# Patient Record
Sex: Female | Born: 1965 | Race: White | Hispanic: No | Marital: Single | Smoking: Former smoker
Health system: Southern US, Community
[De-identification: ages and names within clinical notes are randomized; demographics above are authoritative.]

## PROBLEM LIST (undated history)

## (undated) DIAGNOSIS — R590 Localized enlarged lymph nodes: Secondary | ICD-10-CM

## (undated) DIAGNOSIS — T7840XA Allergy, unspecified, initial encounter: Secondary | ICD-10-CM

## (undated) HISTORY — DX: Allergy, unspecified, initial encounter: T78.40XA

---

## 1998-04-11 ENCOUNTER — Ambulatory Visit (HOSPITAL_COMMUNITY): Admission: RE | Admit: 1998-04-11 | Discharge: 1998-04-11 | Payer: Self-pay | Admitting: Family Medicine

## 1998-09-17 ENCOUNTER — Ambulatory Visit (HOSPITAL_COMMUNITY): Admission: RE | Admit: 1998-09-17 | Discharge: 1998-09-17 | Payer: Self-pay | Admitting: Family Medicine

## 1998-09-17 ENCOUNTER — Encounter: Payer: Self-pay | Admitting: Family Medicine

## 1999-05-29 ENCOUNTER — Other Ambulatory Visit: Admission: RE | Admit: 1999-05-29 | Discharge: 1999-05-29 | Payer: Self-pay | Admitting: Gynecology

## 2000-12-06 ENCOUNTER — Ambulatory Visit (HOSPITAL_COMMUNITY): Admission: RE | Admit: 2000-12-06 | Discharge: 2000-12-06 | Payer: Self-pay | Admitting: Obstetrics and Gynecology

## 2000-12-06 ENCOUNTER — Encounter: Payer: Self-pay | Admitting: Obstetrics and Gynecology

## 2001-05-15 ENCOUNTER — Inpatient Hospital Stay (HOSPITAL_COMMUNITY): Admission: AD | Admit: 2001-05-15 | Discharge: 2001-05-20 | Payer: Self-pay | Admitting: Obstetrics and Gynecology

## 2004-04-07 ENCOUNTER — Other Ambulatory Visit: Admission: RE | Admit: 2004-04-07 | Discharge: 2004-04-07 | Payer: Self-pay | Admitting: Family Medicine

## 2014-10-03 ENCOUNTER — Encounter (HOSPITAL_BASED_OUTPATIENT_CLINIC_OR_DEPARTMENT_OTHER): Payer: Self-pay | Admitting: Emergency Medicine

## 2014-10-03 ENCOUNTER — Emergency Department (HOSPITAL_BASED_OUTPATIENT_CLINIC_OR_DEPARTMENT_OTHER)
Admission: EM | Admit: 2014-10-03 | Discharge: 2014-10-03 | Disposition: A | Discharge status: Home / Self Care | Payer: BLUE CROSS/BLUE SHIELD | Attending: Emergency Medicine | Admitting: Emergency Medicine

## 2014-10-03 DIAGNOSIS — I951 Orthostatic hypotension: Secondary | ICD-10-CM

## 2014-10-03 DIAGNOSIS — R55 Syncope and collapse: Secondary | ICD-10-CM

## 2014-10-03 LAB — CBC WITH DIFFERENTIAL/PLATELET
BASOS PCT: 0 % (ref 0–1)
Basophils Absolute: 0 10*3/uL (ref 0.0–0.1)
Eosinophils Absolute: 0 10*3/uL (ref 0.0–0.7)
Eosinophils Relative: 0 % (ref 0–5)
HEMATOCRIT: 36.4 % (ref 36.0–46.0)
HEMOGLOBIN: 12.6 g/dL (ref 12.0–15.0)
Lymphocytes Relative: 18 % (ref 12–46)
Lymphs Abs: 1.1 10*3/uL (ref 0.7–4.0)
MCH: 31.5 pg (ref 26.0–34.0)
MCHC: 34.6 g/dL (ref 30.0–36.0)
MCV: 91 fL (ref 78.0–100.0)
MONO ABS: 0.4 10*3/uL (ref 0.1–1.0)
MONOS PCT: 7 % (ref 3–12)
Neutro Abs: 4.5 10*3/uL (ref 1.7–7.7)
Neutrophils Relative %: 75 % (ref 43–77)
Platelets: 277 10*3/uL (ref 150–400)
RBC: 4 MIL/uL (ref 3.87–5.11)
RDW: 11.3 % — ABNORMAL LOW (ref 11.5–15.5)
WBC: 6 10*3/uL (ref 4.0–10.5)

## 2014-10-03 LAB — BASIC METABOLIC PANEL
Anion gap: 2 — ABNORMAL LOW (ref 5–15)
BUN: 14 mg/dL (ref 6–23)
CALCIUM: 9 mg/dL (ref 8.4–10.5)
CO2: 26 mmol/L (ref 19–32)
CREATININE: 0.71 mg/dL (ref 0.50–1.10)
Chloride: 106 mmol/L (ref 96–112)
GFR calc non Af Amer: >90 mL/min (ref >90)
Glucose, Bld: 120 mg/dL — ABNORMAL HIGH (ref 70–99)
Potassium: 3.7 mmol/L (ref 3.5–5.1)
Sodium: 134 mmol/L — ABNORMAL LOW (ref 135–145)

## 2014-10-03 MED ORDER — SODIUM CHLORIDE 0.9 % IV BOLUS (SEPSIS)
1000.0000 mL | Freq: Once | INTRAVENOUS | Status: AC
  Administered 2014-10-03: 1000 mL via INTRAVENOUS

## 2014-10-03 NOTE — Discharge Instructions (Signed)
Syncope °Syncope is a medical term for fainting or passing out. This means you lose consciousness and drop to the ground. People are generally unconscious for less than 5 minutes. You may have some muscle twitches for up to 15 seconds before waking up and returning to normal. Syncope occurs more often in older adults, but it can happen to anyone. While most causes of syncope are not dangerous, syncope can be a sign of a serious medical problem. It is important to seek medical care.  °CAUSES  °Syncope is caused by a sudden drop in blood flow to the brain. The specific cause is often not determined. Factors that can bring on syncope include: °· Taking medicines that lower blood pressure. °· Sudden changes in posture, such as standing up quickly. °· Taking more medicine than prescribed. °· Standing in one place for too long. °· Seizure disorders. °· Dehydration and excessive exposure to heat. °· Low blood sugar (hypoglycemia). °· Straining to have a bowel movement. °· Heart disease, irregular heartbeat, or other circulatory problems. °· Fear, emotional distress, seeing blood, or severe pain. °SYMPTOMS  °Right before fainting, you may: °· Feel dizzy or light-headed. °· Feel nauseous. °· See all white or all black in your field of vision. °· Have cold, clammy skin. °DIAGNOSIS  °Your health care provider will ask about your symptoms, perform a physical exam, and perform an electrocardiogram (ECG) to record the electrical activity of your heart. Your health care provider may also perform other heart or blood tests to determine the cause of your syncope which may include: °· Transthoracic echocardiogram (TTE). During echocardiography, sound waves are used to evaluate how blood flows through your heart. °· Transesophageal echocardiogram (TEE). °· Cardiac monitoring. This allows your health care provider to monitor your heart rate and rhythm in real time. °· Holter monitor. This is a portable device that records your  heartbeat and can help diagnose heart arrhythmias. It allows your health care provider to track your heart activity for several days, if needed. °· Stress tests by exercise or by giving medicine that makes the heart beat faster. °TREATMENT  °In most cases, no treatment is needed. Depending on the cause of your syncope, your health care provider may recommend changing or stopping some of your medicines. °HOME CARE INSTRUCTIONS °· Have someone stay with you until you feel stable. °· Do not drive, use machinery, or play sports until your health care provider says it is okay. °· Keep all follow-up appointments as directed by your health care provider. °· Lie down right away if you start feeling like you might faint. Breathe deeply and steadily. Wait until all the symptoms have passed. °· Drink enough fluids to keep your urine clear or pale yellow. °· If you are taking blood pressure or heart medicine, get up slowly and take several minutes to sit and then stand. This can reduce dizziness. °SEEK IMMEDIATE MEDICAL CARE IF:  °· You have a severe headache. °· You have unusual pain in the chest, abdomen, or back. °· You are bleeding from your mouth or rectum, or you have black or tarry stool. °· You have an irregular or very fast heartbeat. °· You have pain with breathing. °· You have repeated fainting or seizure-like jerking during an episode. °· You faint when sitting or lying down. °· You have confusion. °· You have trouble walking. °· You have severe weakness. °· You have vision problems. °If you fainted, call your local emergency services (911 in [place] Do not drive   yourself to the hospital.  °MAKE SURE YOU: °· Understand these instructions. °· Will watch your condition. °· Will get help right away if you are not doing well or get worse. °Document Released: 07/26/2005 Document Revised: 07/31/2013 Document Reviewed: 09/24/2011 °[employer]® Patient Information ©2015 [employer]. This information is not intended to replace  advice given to you by your health care provider. Make sure you discuss any questions you have with your health care provider. ° °

## 2014-10-03 NOTE — ED Provider Notes (Signed)
CSN: [id number]     Arrival date & time 10/03/14  1420 History   First MD Initiated Contact with Patient 10/03/14 1423     Chief Complaint  Patient presents with  . Near Syncope     (Consider location/radiation/quality/duration/timing/severity/associated sxs/prior Treatment) HPI Comments: Patient presents to the ER for evaluation of syncope. Patient reports that she donated blood this morning. She went back to her office and then started to feel faint. She got weak, dizzy and then did briefly pass out. After she came around, she felt like she was going to pass out again. EMS was contacted and they found her awake, with a blood pressure of 70/30. Patient was transported to the emergency department. She reports that she is feeling much better now.  Patient denies any recent illness. She has not had any nausea, vomiting or diarrhea. She has no fever. There has not been any chest pain, shortness of breath, abdominal pain associated with her symptoms today.  Patient is a 49 y.o. female presenting with near-syncope.  Near Syncope Pertinent negatives include no chest pain and no shortness of breath.    No past medical history on file. No past surgical history on file. No family history on file. History  Substance Use Topics  . Smoking status: Never Smoker   . Smokeless tobacco: Not on file  . Alcohol Use: Not on file   OB History    No data available     Review of Systems  Respiratory: Negative for shortness of breath.   Cardiovascular: Positive for near-syncope. Negative for chest pain.  Neurological: Positive for dizziness and syncope.  All other systems reviewed and are negative.     Allergies  Review of patient's allergies indicates no known allergies.  Home Medications   Prior to Admission medications   Not on File   BP 107/68 mmHg  Pulse 74  Temp(Src) 98.7 F (37.1 C) (Oral)  Resp 18  Ht 5' 4.5" (1.638 m)  Wt 135 lb (61.236 kg)  BMI 22.82 kg/m2  SpO2  100% Physical Exam  Constitutional: She is oriented to person, place, and time. She appears well-developed and well-nourished. No distress.  HENT:  Head: Normocephalic and atraumatic.  Right Ear: Hearing normal.  Left Ear: Hearing normal.  Nose: Nose normal.  Mouth/Throat: Oropharynx is clear and moist and mucous membranes are normal.  Eyes: Conjunctivae and EOM are normal. Pupils are equal, round, and reactive to light.  Neck: Normal range of motion. Neck supple.  Cardiovascular: Regular rhythm, S1 normal and S2 normal.  Exam reveals no gallop and no friction rub.   No murmur heard. Pulmonary/Chest: Effort normal and breath sounds normal. No respiratory distress. She exhibits no tenderness.  Abdominal: Soft. Normal appearance and bowel sounds are normal. There is no hepatosplenomegaly. There is no tenderness. There is no rebound, no guarding, no tenderness at McBurney's point and negative Murphy's sign. No hernia.  Musculoskeletal: Normal range of motion.  Neurological: She is alert and oriented to person, place, and time. She has normal strength. No cranial nerve deficit or sensory deficit. Coordination normal. GCS eye subscore is 4. GCS verbal subscore is 5. GCS motor subscore is 6.  Skin: Skin is warm, dry and intact. No rash noted. No cyanosis.  Psychiatric: She has a normal mood and affect. Her speech is normal and behavior is normal. Thought content normal.  Nursing note and vitals reviewed.   ED Course  Procedures (including critical care time) Labs Review Labs Reviewed  CBC  WITH DIFFERENTIAL/PLATELET - Abnormal; Notable for the following:    RDW 11.3 (*)    All other components within normal limits  BASIC METABOLIC PANEL    Imaging Review No results found.   EKG Interpretation   Date/Time:  Thursday October 03 2014 14:36:36 EST Ventricular Rate:  71 PR Interval:  116 QRS Duration: 72 QT Interval:  372 QTC Calculation: 404 R Axis:   75 Text Interpretation:   Normal sinus rhythm Normal ECG Confirmed by Arnita Koons   MD, Gayatri Teasdale [id number]) on 10/03/2014 3:03:11 PM      MDM   Final diagnoses:  None   syncope  Patient presents to the ER for evaluation of syncope and dizziness after giving blood. Patient did successfully give blood and was able to go back to her office before she became weak, dizzy and then had a brief syncopal episode. She continued to have some dizziness and feeling like she was going to pass out after she woke up. By time of arrival to the ER, however, she is symptom-free. She is still mildly orthostatic with some increase in her heart rate. Her lab work is unremarkable. Neurologic exam is unremarkable. EKG is normal. Patient to minister to IV fluids with improvement. Symptoms are secondary to getting blood, orthostasis. Will discharge, increase fluid intake.    Orpah Greek, MD 10/03/14 [phone]

## 2014-10-03 NOTE — ED Notes (Signed)
Per EMS:  Pt gave blood today.  Pt had one syncopal episode and one near syncopal episode afterwards.  Pts BP 70/30 when EMS arrived.  Pt is feeling better now, here for evaluation.

## 2015-03-10 ENCOUNTER — Ambulatory Visit (INDEPENDENT_AMBULATORY_CARE_PROVIDER_SITE_OTHER): Payer: BLUE CROSS/BLUE SHIELD | Admitting: Internal Medicine

## 2015-03-10 ENCOUNTER — Encounter: Payer: Self-pay | Admitting: Internal Medicine

## 2015-03-10 VITALS — BP 124/88 | HR 82 | Temp 98.0°F | Resp 18 | Ht 64.5 in | Wt 154.0 lb

## 2015-03-10 DIAGNOSIS — Z1212 Encounter for screening for malignant neoplasm of rectum: Secondary | ICD-10-CM

## 2015-03-10 DIAGNOSIS — Z131 Encounter for screening for diabetes mellitus: Secondary | ICD-10-CM

## 2015-03-10 DIAGNOSIS — Z1389 Encounter for screening for other disorder: Secondary | ICD-10-CM

## 2015-03-10 DIAGNOSIS — E559 Vitamin D deficiency, unspecified: Secondary | ICD-10-CM

## 2015-03-10 DIAGNOSIS — Z130 Encounter for screening for diseases of the blood and blood-forming organs and certain disorders involving the immune mechanism: Secondary | ICD-10-CM

## 2015-03-10 DIAGNOSIS — Z0000 Encounter for general adult medical examination without abnormal findings: Secondary | ICD-10-CM

## 2015-03-10 DIAGNOSIS — Z79899 Other long term (current) drug therapy: Secondary | ICD-10-CM

## 2015-03-10 DIAGNOSIS — M722 Plantar fascial fibromatosis: Secondary | ICD-10-CM

## 2015-03-10 DIAGNOSIS — Z13220 Encounter for screening for lipoid disorders: Secondary | ICD-10-CM

## 2015-03-10 DIAGNOSIS — Z1329 Encounter for screening for other suspected endocrine disorder: Secondary | ICD-10-CM

## 2015-03-10 DIAGNOSIS — Z136 Encounter for screening for cardiovascular disorders: Secondary | ICD-10-CM

## 2015-03-10 DIAGNOSIS — I10 Essential (primary) hypertension: Secondary | ICD-10-CM

## 2015-03-10 LAB — BASIC METABOLIC PANEL WITH GFR
BUN: 10 mg/dL (ref 7–25)
CO2: 27 mmol/L (ref 20–31)
Calcium: 10.1 mg/dL (ref 8.6–10.2)
Chloride: 104 mmol/L (ref 98–110)
Creat: 0.69 mg/dL (ref 0.50–1.10)
GFR, Est African American: >89 mL/min (ref >=60)
GFR, Est Non African American: >89 mL/min (ref >=60)
Glucose, Bld: 63 mg/dL — ABNORMAL LOW (ref 65–99)
Potassium: 3.8 mmol/L (ref 3.5–5.3)
Sodium: 140 mmol/L (ref 135–146)

## 2015-03-10 LAB — CBC WITH DIFFERENTIAL/PLATELET
Basophils Absolute: 0 10*3/uL (ref 0.0–0.1)
Basophils Relative: 1 % (ref 0–1)
Eosinophils Absolute: 0 10*3/uL (ref 0.0–0.7)
Eosinophils Relative: 1 % (ref 0–5)
HCT: 41.8 % (ref 36.0–46.0)
Hemoglobin: 14 g/dL (ref 12.0–15.0)
Lymphocytes Relative: 29 % (ref 12–46)
Lymphs Abs: 1.1 10*3/uL (ref 0.7–4.0)
MCH: 31 pg (ref 26.0–34.0)
MCHC: 33.5 g/dL (ref 30.0–36.0)
MCV: 92.5 fL (ref 78.0–100.0)
MPV: 9.2 fL (ref 8.6–12.4)
Monocytes Absolute: 0.3 10*3/uL (ref 0.1–1.0)
Monocytes Relative: 8 % (ref 3–12)
Neutro Abs: 2.3 10*3/uL (ref 1.7–7.7)
Neutrophils Relative %: 61 % (ref 43–77)
Platelets: 319 10*3/uL (ref 150–400)
RBC: 4.52 MIL/uL (ref 3.87–5.11)
RDW: 13.5 % (ref 11.5–15.5)
WBC: 3.8 10*3/uL — ABNORMAL LOW (ref 4.0–10.5)

## 2015-03-10 LAB — HEPATIC FUNCTION PANEL
ALT: 25 U/L (ref 6–29)
AST: 21 U/L (ref 10–35)
Albumin: 4.2 g/dL (ref 3.6–5.1)
Alkaline Phosphatase: 76 U/L (ref 33–115)
Bilirubin, Direct: 0.1 mg/dL (ref <=0.2)
Indirect Bilirubin: 0.5 mg/dL (ref 0.2–1.2)
Total Bilirubin: 0.6 mg/dL (ref 0.2–1.2)
Total Protein: 7 g/dL (ref 6.1–8.1)

## 2015-03-10 LAB — IRON AND TIBC
%SAT: 36 % (ref 20–55)
Iron: 130 ug/dL (ref 42–145)
TIBC: 364 ug/dL (ref 250–470)
UIBC: 234 ug/dL (ref 125–400)

## 2015-03-10 LAB — LIPID PANEL
Cholesterol: 262 mg/dL — ABNORMAL HIGH (ref 125–200)
HDL: 60 mg/dL (ref >=46)
LDL Cholesterol: 168 mg/dL — ABNORMAL HIGH (ref <130)
Total CHOL/HDL Ratio: 4.4 Ratio (ref <=5.0)
Triglycerides: 168 mg/dL — ABNORMAL HIGH (ref <150)
VLDL: 34 mg/dL — ABNORMAL HIGH (ref <30)

## 2015-03-10 LAB — MAGNESIUM: Magnesium: 1.8 mg/dL (ref 1.5–2.5)

## 2015-03-10 MED ORDER — MELOXICAM 15 MG PO TABS: 15.0000 mg | ORAL_TABLET | Freq: Every day | ORAL | Status: DC

## 2015-03-10 NOTE — Progress Notes (Signed)
Patient ID: Ashley Carpenter, female   DOB: February 24, 1966, 49 y.o.   MRN: [id number]    Annual Screening Comprehensive Examination   This very nice 49 y.o.female presents for complete physical.  Patient has no major health issues and does not take any medications currently.  She is coming to establish as a new patient.   Patient reports that she does have a family history of colon cancer in her mother who passed away from it at age 7.  Her mother was diagnosed with colon cancer at age 85.    She does report that she occasionally has some palpitations that are intermittent and lasts for a minute.  She reports that she originally had thought it was due to anxiety as she has had a job change and her brother passed this year.  She reports that it happens at rest and also with activity.  She reports that she does have 2 cups of coffee daily. She reports no SOB or chest pain with palpitations.    She also reports that her feet are bothering her.  She reports that she has pain especially when she is getting up and going are terrible.  She reports that if she does not wear shoes with good support than they really bother her.  She is currently post menopausal.    Finally, patient has history of Vitamin D Deficiency and last vitamin D was No results found for: VD25OH.  Currently on supplementation.   No current outpatient prescriptions on file prior to visit.   No current facility-administered medications on file prior to visit.    No Known Allergies  Past Medical History  Diagnosis Date  . Allergy     There is no immunization history on file for this patient.  No past surgical history on file.  Family History  Problem Relation Age of Onset  . Cancer Mother   . Hyperlipidemia Mother   . Diabetes Father   . Heart disease Father   . Hypertension Father   . Cancer Maternal Grandmother   . Heart disease Maternal Grandfather   . Hypertension Maternal Grandfather   . Diabetes Paternal  Grandfather     History   Social History  . Marital Status: Single    Spouse Name: N/A  . Number of Children: N/A  . Years of Education: N/A   Occupational History  . Not on file.   Social History Main Topics  . Smoking status: Former Smoker    Quit date: 08/10/1983  . Smokeless tobacco: Not on file  . Alcohol Use: 0.6 oz/week    0 Standard drinks or equivalent, 1 Glasses of wine per week  . Drug Use: Not on file  . Sexual Activity: Not on file   Other Topics Concern  . Not on file   Social History Narrative   Review of Systems  Constitutional: Positive for malaise/fatigue. Negative for fever, chills and weight loss.  HENT: Negative for congestion, ear pain and sore throat.   Eyes: Negative.   Respiratory: Negative for cough, shortness of breath and wheezing.   Cardiovascular: Positive for palpitations. Negative for chest pain and leg swelling.  Gastrointestinal: Negative for heartburn, nausea, vomiting, diarrhea, constipation, blood in stool and melena.  Genitourinary: Negative.   Skin: Negative.   Neurological: Negative for dizziness, sensory change, loss of consciousness and headaches.  Psychiatric/Behavioral: Negative for depression. The patient is not nervous/anxious and does not have insomnia.       Physical Exam  BP 124/88 mmHg  Pulse 82  Temp(Src) 98 F (36.7 C) (Temporal)  Resp 18  Ht 5' 4.5" (1.638 m)  Wt 154 lb (69.854 kg)  BMI 26.04 kg/m2  General Appearance: Well nourished and in no apparent distress. Eyes: PERRLA, EOMs, conjunctiva no swelling or erythema, normal fundi and vessels. Sinuses: No frontal/maxillary tenderness ENT/Mouth: EACs patent / TMs  nl. Nares clear without erythema, swelling, mucoid exudates. Oral hygiene is good. No erythema, swelling, or exudate. Tongue normal, non-obstructing. Tonsils not swollen or erythematous. Hearing normal.  Neck: Supple, thyroid normal. No bruits, nodes or JVD. Respiratory: Respiratory effort normal.   BS equal and clear bilateral without rales, rhonci, wheezing or stridor. Cardio: Heart sounds are normal with regular rate and rhythm and no murmurs, rubs or gallops. Peripheral pulses are normal and equal bilaterally without edema. No aortic or femoral bruits. Chest: symmetric with normal excursions and percussion. Abdomen: Flat, soft, with bowl sounds. Nontender, no guarding, rebound, hernias, masses, or organomegaly.  Lymphatics: Non tender without lymphadenopathy.  Musculoskeletal: Full ROM all peripheral extremities, joint stability, 5/5 strength, and normal gait.  Pain at the insertion of the plantar fascia bilaterally Skin: Warm and dry without rashes, lesions, cyanosis, clubbing or  ecchymosis.  Neuro: Cranial nerves intact, reflexes equal bilaterally. Normal muscle tone, no cerebellar symptoms. Sensation intact.  Pysch: Awake and oriented X 3, normal affect, Insight and Judgment appropriate.   Assessment and Plan    1. Vitamin D deficiency -start supplement - Vit D  25 hydroxy (rtn osteoporosis monitoring)  2. Medication management  - CBC with Differential/Platelet - BASIC METABOLIC PANEL WITH GFR - Hepatic function panel - Magnesium  3. Screening for cardiovascular condition  - EKG 12-Lead  4. Screening for diabetes mellitus  - Hemoglobin A1c - Insulin, random  5. Screening for hyperlipidemia  - Lipid panel  6. Screening for deficiency anemia  - Iron and TIBC - Vitamin B12  7. Screening for hematuria or proteinuria  - Urinalysis, Routine w reflex microscopic (not at [facility]) - Microalbumin / creatinine urine ratio  8. Screening for thyroid disorder  - TSH  9. Screening for rectal cancer  - Ambulatory referral to Gastroenterology  10.  Plantar Fascitis -mobic -good shoes -RICE -if not improved in 1 month than consider referral to ortho   Continue prudent diet as discussed, weight control, regular exercise, and medications. Routine screening labs  and tests as requested with regular follow-up as recommended.  Over 40 minutes of exam, counseling, chart review and critical decision making was performed

## 2015-03-10 NOTE — Patient Instructions (Signed)
Plantar Fasciitis Plantar fasciitis is a common condition that causes foot pain. It is soreness (inflammation) of the band of tough fibrous tissue on the bottom of the foot that runs from the heel bone (calcaneus) to the ball of the foot. The cause of this soreness may be from excessive standing, poor fitting shoes, running on hard surfaces, being overweight, having an abnormal walk, or overuse (this is common in runners) of the painful foot or feet. It is also common in aerobic exercise dancers and ballet dancers. SYMPTOMS  Most people with plantar fasciitis complain of:  Severe pain in the morning on the bottom of their foot especially when taking the first steps out of bed. This pain recedes after a few minutes of walking.  Severe pain is experienced also during walking following a long period of inactivity.  Pain is worse when walking barefoot or up stairs DIAGNOSIS   Your caregiver will diagnose this condition by examining and feeling your foot.  Special tests such as X-rays of your foot, are usually not needed. PREVENTION   Consult a sports medicine professional before beginning a new exercise program.  Walking programs offer a good workout. With walking there is a lower chance of overuse injuries common to runners. There is less impact and less jarring of the joints.  Begin all new exercise programs slowly. If problems or pain develop, decrease the amount of time or distance until you are at a comfortable level.  Wear good shoes and replace them regularly.  Stretch your foot and the heel cords at the back of the ankle (Achilles tendon) both before and after exercise.  Run or exercise on even surfaces that are not hard. For example, asphalt is better than pavement.  Do not run barefoot on hard surfaces.  If using a treadmill, vary the incline.  Do not continue to workout if you have foot or joint problems. Seek professional help if they do not improve. HOME CARE INSTRUCTIONS     Avoid activities that cause you pain until you recover.  Use ice or cold packs on the problem or painful areas after working out.  Only take over-the-counter or prescription medicines for pain, discomfort, or fever as directed by your caregiver.  Soft shoe inserts or athletic shoes with air or gel sole cushions may be helpful.  If problems continue or become more severe, consult a sports medicine caregiver or your own health care provider. Cortisone is a potent anti-inflammatory medication that may be injected into the painful area. You can discuss this treatment with your caregiver. MAKE SURE YOU:   Understand these instructions.  Will watch your condition.  Will get help right away if you are not doing well or get worse. Document Released: 04/20/2001 Document Revised: 10/18/2011 Document Reviewed: 06/19/2008 [employer] Patient Information 2015 [employer]. This information is not intended to replace advice given to you by your health care provider. Make sure you discuss any questions you have with your health care provider.   Preventive Care for Adults  A healthy lifestyle and preventive care can promote health and wellness. Preventive health guidelines for women include the following key practices.  A routine yearly physical is a good way to check with your health care provider about your health and preventive screening. It is a chance to share any concerns and updates on your health and to receive a thorough exam.  Visit your dentist for a routine exam and preventive care every 6 months. Brush your teeth twice a day   and floss once a day. Good oral hygiene prevents tooth decay and gum disease.  The frequency of eye exams is based on your age, health, family medical history, use of contact lenses, and other factors. Follow your health care provider's recommendations for frequency of eye exams.  Eat a healthy diet. Foods like vegetables, fruits, whole grains, low-fat dairy  products, and lean protein foods contain the nutrients you need without too many calories. Decrease your intake of foods high in solid fats, added sugars, and salt. Eat the right amount of calories for you.Get information about a proper diet from your health care provider, if necessary.  Regular physical exercise is one of the most important things you can do for your health. Most adults should get at least 150 minutes of moderate-intensity exercise (any activity that increases your heart rate and causes you to sweat) each week. In addition, most adults need muscle-strengthening exercises on 2 or more days a week.  Maintain a healthy weight. The body mass index (BMI) is a screening tool to identify possible weight problems. It provides an estimate of body fat based on height and weight. Your health care provider can find your BMI and can help you achieve or maintain a healthy weight.For adults 20 years and older:  A BMI below 18.5 is considered underweight.  A BMI of 18.5 to 24.9 is normal.  A BMI of 25 to 29.9 is considered overweight.  A BMI of 30 and above is considered obese.  Maintain normal blood lipids and cholesterol levels by exercising and minimizing your intake of saturated fat. Eat a balanced diet with plenty of fruit and vegetables. Blood tests for lipids and cholesterol should begin at age 77 and be repeated every 5 years. If your lipid or cholesterol levels are high, you are over 50, or you are at high risk for heart disease, you may need your cholesterol levels checked more frequently.Ongoing high lipid and cholesterol levels should be treated with medicines if diet and exercise are not working.  If you smoke, find out from your health care provider how to quit. If you do not use tobacco, do not start.  Lung cancer screening is recommended for adults aged 13-80 years who are at high risk for developing lung cancer because of a history of smoking. A yearly low-dose CT scan of the  lungs is recommended for people who have at least a 30-pack-year history of smoking and are a current smoker or have quit within the past 15 years. A pack year of smoking is smoking an average of 1 pack of cigarettes a day for 1 year (for example: 1 pack a day for 30 years or 2 packs a day for 15 years). Yearly screening should continue until the smoker has stopped smoking for at least 15 years. Yearly screening should be stopped for people who develop a health problem that would prevent them from having lung cancer treatment.  High blood pressure causes heart disease and increases the risk of stroke. Your blood pressure should be checked at least every 1 to 2 years. Ongoing high blood pressure should be treated with medicines if weight loss and exercise do not work.  If you are 26-78 years old, ask your health care provider if you should take aspirin to prevent strokes.  Diabetes screening involves taking a blood sample to check your fasting blood sugar level. This should be done once every 3 years, after age 38, if you are within normal weight and without risk  factors for diabetes. Testing should be considered at a younger age or be carried out more frequently if you are overweight and have at least 1 risk factor for diabetes.  Breast cancer screening is essential preventive care for women. You should practice "breast self-awareness." This means understanding the normal appearance and feel of your breasts and may include breast self-examination. Any changes detected, no matter how small, should be reported to a health care provider. Women in their 38s and 30s should have a clinical breast exam (CBE) by a health care provider as part of a regular health exam every 1 to 3 years. After age 7, women should have a CBE every year. Starting at age 29, women should consider having a mammogram (breast X-ray test) every year. Women who have a family history of breast cancer should talk to their health care provider  about genetic screening. Women at a high risk of breast cancer should talk to their health care providers about having an MRI and a mammogram every year.  Breast cancer gene (BRCA)-related cancer risk assessment is recommended for women who have family members with BRCA-related cancers. BRCA-related cancers include breast, ovarian, tubal, and peritoneal cancers. Having family members with these cancers may be associated with an increased risk for harmful changes (mutations) in the breast cancer genes BRCA1 and BRCA2. Results of the assessment will determine the need for genetic counseling and BRCA1 and BRCA2 testing.  Routine pelvic exams to screen for cancer are no longer recommended for nonpregnant women who are considered low risk for cancer of the pelvic organs (ovaries, uterus, and vagina) and who do not have symptoms. Ask your health care provider if a screening pelvic exam is right for you.  If you have had past treatment for cervical cancer or a condition that could lead to cancer, you need Pap tests and screening for cancer for at least 20 years after your treatment. If Pap tests have been discontinued, your risk factors (such as having a new sexual partner) need to be reassessed to determine if screening should be resumed. Some women have medical problems that increase the chance of getting cervical cancer. In these cases, your health care provider may recommend more frequent screening and Pap tests.  Colorectal cancer can be detected and often prevented. Most routine colorectal cancer screening begins at the age of 13 years and continues through age 9 years. However, your health care provider may recommend screening at an earlier age if you have risk factors for colon cancer. On a yearly basis, your health care provider may provide home test kits to check for hidden blood in the stool. Use of a small camera at the end of a tube, to directly examine the colon (sigmoidoscopy or colonoscopy), can  detect the earliest forms of colorectal cancer. Talk to your health care provider about this at age 84, when routine screening begins. Direct exam of the colon should be repeated every 5-10 years through age 64 years, unless early forms of pre-cancerous polyps or small growths are found.  Hepatitis C blood testing is recommended for all people born from [birth year] through 1965 and any individual with known risks for hepatitis C.  Pra  Osteoporosis is a disease in which the bones lose minerals and strength with aging. This can result in serious bone fractures or breaks. The risk of osteoporosis can be identified using a bone density scan. Women ages 36 years and over and women at risk for fractures or osteoporosis should discuss screening with  their health care providers. Ask your health care provider whether you should take a calcium supplement or vitamin D to reduce the rate of osteoporosis.  Menopause can be associated with physical symptoms and risks. Hormone replacement therapy is available to decrease symptoms and risks. You should talk to your health care provider about whether hormone replacement therapy is right for you.  Use sunscreen. Apply sunscreen liberally and repeatedly throughout the day. You should seek shade when your shadow is shorter than you. Protect yourself by wearing long sleeves, pants, a wide-brimmed hat, and sunglasses year round, whenever you are outdoors.  Once a month, do a whole body skin exam, using a mirror to look at the skin on your back. Tell your health care provider of new moles, moles that have irregular borders, moles that are larger than a pencil eraser, or moles that have changed in shape or color.  Stay current with required vaccines (immunizations).  Influenza vaccine. All adults should be immunized every year.  Tetanus, diphtheria, and acellular pertussis (Td, Tdap) vaccine. Pregnant women should receive 1 dose of Tdap vaccine during each pregnancy. The dose  should be obtained regardless of the length of time since the last dose. Immunization is preferred during the 27th-36th week of gestation. An adult who has not previously received Tdap or who does not know her vaccine status should receive 1 dose of Tdap. This initial dose should be followed by tetanus and diphtheria toxoids (Td) booster doses every 10 years. Adults with an unknown or incomplete history of completing a 3-dose immunization series with Td-containing vaccines should begin or complete a primary immunization series including a Tdap dose. Adults should receive a Td booster every 10 years.  Varicella vaccine. An adult without evidence of immunity to varicella should receive 2 doses or a second dose if she has previously received 1 dose. Pregnant females who do not have evidence of immunity should receive the first dose after pregnancy. This first dose should be obtained before leaving the health care facility. The second dose should be obtained 4-8 weeks after the first dose.  Human papillomavirus (HPV) vaccine. Females aged 13-26 years who have not received the vaccine previously should obtain the 3-dose series. The vaccine is not recommended for use in pregnant females. However, pregnancy testing is not needed before receiving a dose. If a female is found to be pregnant after receiving a dose, no treatment is needed. In that case, the remaining doses should be delayed until after the pregnancy. Immunization is recommended for any person with an immunocompromised condition through the age of 15 years if she did not get any or all doses earlier. During the 3-dose series, the second dose should be obtained 4-8 weeks after the first dose. The third dose should be obtained 24 weeks after the first dose and 16 weeks after the second dose.  Zoster vaccine. One dose is recommended for adults aged 35 years or older unless certain conditions are present.  Measles, mumps, and rubella (MMR) vaccine. Adults  born before 14 generally are considered immune to measles and mumps. Adults born in [birth year] or later should have 1 or more doses of MMR vaccine unless there is a contraindication to the vaccine or there is laboratory evidence of immunity to each of the three diseases. A routine second dose of MMR vaccine should be obtained at least 28 days after the first dose for students attending postsecondary schools, health care workers, or international travelers. People who received inactivated measles vaccine or  an unknown type of measles vaccine during 1963-1967 should receive 2 doses of MMR vaccine. People who received inactivated mumps vaccine or an unknown type of mumps vaccine before 1979 and are at high risk for mumps infection should consider immunization with 2 doses of MMR vaccine. For females of childbearing age, rubella immunity should be determined. If there is no evidence of immunity, females who are not pregnant should be vaccinated. If there is no evidence of immunity, females who are pregnant should delay immunization until after pregnancy. Unvaccinated health care workers born before [birth year] who lack laboratory evidence of measles, mumps, or rubella immunity or laboratory confirmation of disease should consider measles and mumps immunization with 2 doses of MMR vaccine or rubella immunization with 1 dose of MMR vaccine.  Pneumococcal 13-valent conjugate (PCV13) vaccine. When indicated, a person who is uncertain of her immunization history and has no record of immunization should receive the PCV13 vaccine. An adult aged 28 years or older who has certain medical conditions and has not been previously immunized should receive 1 dose of PCV13 vaccine. This PCV13 should be followed with a dose of pneumococcal polysaccharide (PPSV23) vaccine. The PPSV23 vaccine dose should be obtained at least 8 weeks after the dose of PCV13 vaccine. An adult aged 29 years or older who has certain medical conditions and previously  received 1 or more doses of PPSV23 vaccine should receive 1 dose of PCV13. The PCV13 vaccine dose should be obtained 1 or more years after the last PPSV23 vaccine dose.    Pneumococcal polysaccharide (PPSV23) vaccine. When PCV13 is also indicated, PCV13 should be obtained first. All adults aged 96 years and older should be immunized. An adult younger than age 61 years who has certain medical conditions should be immunized. Any person who resides in a nursing home or long-term care facility should be immunized. An adult smoker should be immunized. People with an immunocompromised condition and certain other conditions should receive both PCV13 and PPSV23 vaccines. People with human immunodeficiency virus (HIV) infection should be immunized as soon as possible after diagnosis. Immunization during chemotherapy or radiation therapy should be avoided. Routine use of PPSV23 vaccine is not recommended for American Indians, [place]atives, or people younger than 65 years unless there are medical conditions that require PPSV23 vaccine. When indicated, people who have unknown immunization and have no record of immunization should receive PPSV23 vaccine. One-time revaccination 5 years after the first dose of PPSV23 is recommended for people aged 19-64 years who have chronic kidney failure, nephrotic syndrome, asplenia, or immunocompromised conditions. People who received 1-2 doses of PPSV23 before age 10 years should receive another dose of PPSV23 vaccine at age 69 years or later if at least 5 years have passed since the previous dose. Doses of PPSV23 are not needed for people immunized with PPSV23 at or after age 7 years.  Preventive Services / Frequency   Ages 55 to 21 years  Blood pressure check.  Lipid and cholesterol check.  Lung cancer screening. / Every year if you are aged 3-80 years and have a 30-pack-year history of smoking and currently smoke or have quit within the past 15 years. Yearly screening  is stopped once you have quit smoking for at least 15 years or develop a health problem that would prevent you from having lung cancer treatment.  Clinical breast exam.** / Every year after age 72 years.  BRCA-related cancer risk assessment.** / For women who have family members with a BRCA-related cancer (breast, ovarian,  tubal, or peritoneal cancers).  Mammogram.** / Every year beginning at age 45 years and continuing for as long as you are in good health. Consult with your health care provider.  Pap test.** / Every 3 years starting at age 53 years through age 23 or 74 years with a history of 3 consecutive normal Pap tests.  HPV screening.** / Every 3 years from ages 19 years through ages 35 to 64 years with a history of 3 consecutive normal Pap tests.  Fecal occult blood test (FOBT) of stool. / Every year beginning at age 40 years and continuing until age 10 years. You may not need to do this test if you get a colonoscopy every 10 years.  Flexible sigmoidoscopy or colonoscopy.** / Every 5 years for a flexible sigmoidoscopy or every 10 years for a colonoscopy beginning at age 47 years and continuing until age 63 years.  Hepatitis C blood test.** / For all people born from [birth year] through 1966 and any individual with known risks for hepatitis C.  Skin self-exam. / Monthly.  Influenza vaccine. / Every year.  Tetanus, diphtheria, and acellular pertussis (Tdap/Td) vaccine.** / Consult your health care provider. Pregnant women should receive 1 dose of Tdap vaccine during each pregnancy. 1 dose of Td every 10 years.  Varicella vaccine.** / Consult your health care provider. Pregnant females who do not have evidence of immunity should receive the first dose after pregnancy.  Zoster vaccine.** / 1 dose for adults aged 89 years or older.  Pneumococcal 13-valent conjugate (PCV13) vaccine.** / Consult your health care provider.  Pneumococcal polysaccharide (PPSV23) vaccine.** / 1 to 2 doses if you  smoke cigarettes or if you have certain conditions.  Meningococcal vaccine.** / Consult your health care provider.  Hepatitis A vaccine.** / Consult your health care provider.  Hepatitis B vaccine.** / Consult your health care provider. Screening for abdominal aortic aneurysm (AAA)  by ultrasound is recommended for people over 50 who have history of high blood pressure or who are current or former smokers.

## 2015-03-11 LAB — URINALYSIS, ROUTINE W REFLEX MICROSCOPIC
Bilirubin Urine: NEGATIVE
Glucose, UA: NEGATIVE
Hgb urine dipstick: NEGATIVE
Ketones, ur: NEGATIVE
Nitrite: NEGATIVE
Protein, ur: NEGATIVE
Specific Gravity, Urine: 1.003 (ref 1.001–1.035)
pH: 6.5 (ref 5.0–8.0)

## 2015-03-11 LAB — INSULIN, RANDOM: Insulin: 25.4 u[IU]/mL — ABNORMAL HIGH (ref 2.0–19.6)

## 2015-03-11 LAB — HEMOGLOBIN A1C
Hgb A1c MFr Bld: 5.1 % (ref <5.7)
Mean Plasma Glucose: 100 mg/dL (ref <117)

## 2015-03-11 LAB — URINALYSIS, MICROSCOPIC ONLY
Bacteria, UA: NONE SEEN [HPF]
CRYSTALS: NONE SEEN [HPF]
Casts: NONE SEEN [LPF]
RBC / HPF: NONE SEEN RBC/HPF (ref <=2)
SQUAMOUS EPITHELIAL / LPF: NONE SEEN [HPF] (ref <=5)
WBC UA: NONE SEEN WBC/HPF (ref <=5)
Yeast: NONE SEEN [HPF]

## 2015-03-11 LAB — VITAMIN D 25 HYDROXY (VIT D DEFICIENCY, FRACTURES): VIT D 25 HYDROXY: 25 ng/mL — AB (ref 30–100)

## 2015-03-11 LAB — MICROALBUMIN / CREATININE URINE RATIO
CREATININE, URINE: 32.3 mg/dL
Microalb, Ur: <0.2 mg/dL (ref <2.0)

## 2015-03-11 LAB — TSH: TSH: 1.309 u[IU]/mL (ref 0.350–4.500)

## 2015-03-11 LAB — VITAMIN B12: Vitamin B-12: 380 pg/mL (ref 211–911)

## 2015-04-16 ENCOUNTER — Encounter: Payer: Self-pay | Admitting: Gastroenterology

## 2015-05-15 ENCOUNTER — Other Ambulatory Visit: Payer: Self-pay | Admitting: Internal Medicine

## 2015-06-17 ENCOUNTER — Other Ambulatory Visit: Payer: Self-pay | Admitting: Internal Medicine

## 2015-06-23 ENCOUNTER — Ambulatory Visit (AMBULATORY_SURGERY_CENTER): Payer: Self-pay | Admitting: *Deleted

## 2015-06-23 VITALS — Ht 64.0 in | Wt 151.8 lb

## 2015-06-23 DIAGNOSIS — Z1211 Encounter for screening for malignant neoplasm of colon: Secondary | ICD-10-CM

## 2015-06-23 MED ORDER — SUPREP BOWEL PREP KIT 17.5-3.13-1.6 GM/177ML PO SOLN: 1.0000 | Freq: Once | ORAL | Status: DC

## 2015-06-23 NOTE — Progress Notes (Signed)
Patient denies any allergies to egg or soy products. Patient denies complications with anesthesia/sedation.  Patient denies oxygen use at home and denies diet medications. Emmi instructions for colonoscopy explained but patient denied.     

## 2015-06-26 ENCOUNTER — Encounter: Payer: Self-pay | Admitting: Gastroenterology

## 2015-07-07 ENCOUNTER — Ambulatory Visit (AMBULATORY_SURGERY_CENTER): Payer: BLUE CROSS/BLUE SHIELD | Admitting: Gastroenterology

## 2015-07-07 ENCOUNTER — Encounter: Payer: Self-pay | Admitting: Gastroenterology

## 2015-07-07 VITALS — BP 109/66 | HR 66 | Temp 97.7°F | Resp 15 | Ht 64.0 in | Wt 151.0 lb

## 2015-07-07 DIAGNOSIS — D122 Benign neoplasm of ascending colon: Secondary | ICD-10-CM | POA: Diagnosis not present

## 2015-07-07 DIAGNOSIS — Z1211 Encounter for screening for malignant neoplasm of colon: Secondary | ICD-10-CM

## 2015-07-07 DIAGNOSIS — K633 Ulcer of intestine: Secondary | ICD-10-CM | POA: Diagnosis not present

## 2015-07-07 DIAGNOSIS — K639 Disease of intestine, unspecified: Secondary | ICD-10-CM | POA: Diagnosis not present

## 2015-07-07 DIAGNOSIS — D123 Benign neoplasm of transverse colon: Secondary | ICD-10-CM | POA: Diagnosis not present

## 2015-07-07 MED ORDER — SODIUM CHLORIDE 0.9 % IV SOLN: 500.0000 mL | INTRAVENOUS | Status: DC

## 2015-07-07 NOTE — Progress Notes (Signed)
Called to room to assist during endoscopic procedure.  Patient ID and intended procedure confirmed with present staff. Received instructions for my participation in the procedure from the performing physician.  

## 2015-07-07 NOTE — Patient Instructions (Signed)
Impressions/recommendations:  Erosions biopsied, await pathology results  YOU HAD AN ENDOSCOPIC PROCEDURE TODAY AT [facility]:   Refer to the procedure report that was given to you for any specific questions about what was found during the examination.  If the procedure report does not answer your questions, please call your gastroenterologist to clarify.  If you requested that your care partner not be given the details of your procedure findings, then the procedure report has been included in a sealed envelope for you to review at your convenience later.  YOU SHOULD EXPECT: Some feelings of bloating in the abdomen. Passage of more gas than usual.  Walking can help get rid of the air that was put into your GI tract during the procedure and reduce the bloating. If you had a lower endoscopy (such as a colonoscopy or flexible sigmoidoscopy) you may notice spotting of blood in your stool or on the toilet paper. If you underwent a bowel prep for your procedure, you may not have a normal bowel movement for a few days.  Please Note:  You might notice some irritation and congestion in your nose or some drainage.  This is from the oxygen used during your procedure.  There is no need for concern and it should clear up in a day or so.  SYMPTOMS TO REPORT IMMEDIATELY:   Following lower endoscopy (colonoscopy or flexible sigmoidoscopy):  Excessive amounts of blood in the stool  Significant tenderness or worsening of abdominal pains  Swelling of the abdomen that is new, acute  Fever of 100F or higher   For urgent or emergent issues, a gastroenterologist can be reached at any hour by calling [phone].   DIET: Your first meal following the procedure should be a small meal and then it is ok to progress to your normal diet. Heavy or fried foods are harder to digest and may make you feel nauseous or bloated.  Likewise, meals heavy in dairy and vegetables can increase bloating.  Drink  plenty of fluids but you should avoid alcoholic beverages for 24 hours.  ACTIVITY:  You should plan to take it easy for the rest of today and you should NOT DRIVE or use heavy machinery until tomorrow (because of the sedation medicines used during the test).    FOLLOW UP: Our staff will call the number listed on your records the next business day following your procedure to check on you and address any questions or concerns that you may have regarding the information given to you following your procedure. If we do not reach you, we will leave a message.  However, if you are feeling well and you are not experiencing any problems, there is no need to return our call.  We will assume that you have returned to your regular daily activities without incident.  If any biopsies were taken you will be contacted by phone or by letter within the next 1-3 weeks.  Please call us at [phone] if you have not heard about the biopsies in 3 weeks.    SIGNATURES/CONFIDENTIALITY: You and/or your care partner have signed paperwork which will be entered into your electronic medical record.  These signatures attest to the fact that that the information above on your After Visit Summary has been reviewed and is understood.  Full responsibility of the confidentiality of this discharge information lies with you and/or your care-partner.

## 2015-07-07 NOTE — Op Note (Signed)
[facility]  [address]. [place], [id number]   COLONOSCOPY PROCEDURE REPORT  PATIENT: Ashley Carpenter, Ashley Carpenter  MR#: [id number] BIRTHDATE: Sep 09, 1965 , 49  yrs. old GENDER: female ENDOSCOPIST: Ladene Artist, MD, [facility] REFERRED BY:  Unk Pinto, M.D. PROCEDURE DATE:  07/07/2015 PROCEDURE:   Colonoscopy, screening and Colonoscopy with biopsy First Screening Colonoscopy - Avg.  risk and is 50 yrs.  old or older Yes.  Prior Negative Screening - Now for repeat screening. N/A  History of Adenoma - Now for follow-up colonoscopy & has been > or = to 3 yrs.  N/A  Polyps removed today? No Recommend repeat exam, <10 yrs? No ASA CLASS:   Class II INDICATIONS:Screening for colonic neoplasia and Colorectal Neoplasm Risk Assessment for this procedure is average risk. MEDICATIONS: Monitored anesthesia care and Propofol 240 mg IV DESCRIPTION OF PROCEDURE:   After the risks benefits and alternatives of the procedure were thoroughly explained, informed consent was obtained.  The digital rectal exam revealed no abnormalities of the rectum.   The LB PFC-H190 [id number]  endoscope was introduced through the anus and advanced to the cecum, which was identified by both the appendix and ileocecal valve. No adverse events experienced.   The quality of the prep was excellent. (Suprep was used)  The instrument was then slowly withdrawn as the colon was fully examined. Estimated blood loss is zero unless otherwise noted in this procedure report.    COLON FINDINGS: An erosion with surrouding nodularity was found in the proximal ascending colon adjancent to the IC valve. Multiple biopsies were performed. A single non-bleeding, clean-based, shallow and round ulcer with surrouding nodularity was found at the hepatic flexure.  Biopsies were taken at the edge of the ulcer. Two shallow and round erosions with surrouding erythema were found at the hepatic flexure. The examination was otherwise  normal. Retroflexed views revealed no abnormalities. The time to cecum = 1.8 Withdrawal time = 12.8   The scope was withdrawn and the procedure completed. COMPLICATIONS: There were no immediate complications.  ENDOSCOPIC IMPRESSION: 1.   Erosion with surrouding nodularity in the proximal ascending colon; multiple biopsies performed 2.   Single ulcer at the hepatic flexure; biopsies performed 3.   Two erosions at the hepatic flexure  RECOMMENDATIONS: 1.  Await pathology results, suspect Mobic related ulcer and erosions 2.  Continue current colorectal screening recommendations for "routine risk" patients with a repeat colonoscopy in 10 years.  eSigned:  Ladene Artist, MD, [facility] 07/07/2015 9:43 AM

## 2015-07-07 NOTE — Progress Notes (Signed)
Report to PACU, RN, vss, BBS= Clear.  

## 2015-07-08 ENCOUNTER — Telehealth: Payer: Self-pay | Admitting: *Deleted

## 2015-07-08 NOTE — Telephone Encounter (Signed)
  Follow up Call-  Call back number 07/07/2015  Post procedure Call Back phone  # [phone]  Permission to leave phone message Yes     Patient questions:  Do you have a fever, pain , or abdominal swelling? No. Pain Score  0 *  Have you tolerated food without any problems? Yes.    Have you been able to return to your normal activities? Yes.    Do you have any questions about your discharge instructions: Diet   No. Medications  No. Follow up visit  No.  Do you have questions or concerns about your Care? No.  Actions: * If pain score is 4 or above: No action needed, pain <4.

## 2015-09-12 ENCOUNTER — Ambulatory Visit (AMBULATORY_SURGERY_CENTER): Payer: Self-pay

## 2015-09-12 VITALS — Ht 64.0 in | Wt 154.6 lb

## 2015-09-12 DIAGNOSIS — Z860100 Personal history of colon polyps, unspecified: Secondary | ICD-10-CM

## 2015-09-12 NOTE — Progress Notes (Signed)
No allergies to eggs or soy No past problems with anesthesia No home oxygen No diet/weight loss meds  Has email and internet; refused emmi 

## 2015-09-26 ENCOUNTER — Ambulatory Visit (AMBULATORY_SURGERY_CENTER): Payer: BLUE CROSS/BLUE SHIELD | Admitting: Gastroenterology

## 2015-09-26 ENCOUNTER — Encounter: Payer: Self-pay | Admitting: Gastroenterology

## 2015-09-26 VITALS — BP 115/57 | HR 64 | Temp 98.0°F | Resp 18 | Ht 64.0 in | Wt 154.0 lb

## 2015-09-26 DIAGNOSIS — K633 Ulcer of intestine: Secondary | ICD-10-CM

## 2015-09-26 DIAGNOSIS — R933 Abnormal findings on diagnostic imaging of other parts of digestive tract: Secondary | ICD-10-CM | POA: Diagnosis not present

## 2015-09-26 MED ORDER — SODIUM CHLORIDE 0.9 % IV SOLN: 500.0000 mL | INTRAVENOUS | Status: DC

## 2015-09-26 NOTE — Progress Notes (Signed)
Discharge instructions given by Judson Roch Monday, RN

## 2015-09-26 NOTE — Op Note (Signed)
[place]  [address]. [place], [address]   COLONOSCOPY PROCEDURE REPORT  PATIENT: Ashley, Carpenter  MR#: [id number] BIRTHDATE: 1965/10/04 , 49  yrs. old GENDER: female ENDOSCOPIST: Ladene Artist, MD, [facility] REFERRED BY:  Unk Pinto, M.D. PROCEDURE DATE:  09/26/2015 PROCEDURE:   Colonoscopy, diagnostic First Screening Colonoscopy - Avg.  risk and is 50 yrs.  old or older - No.  Prior Negative Screening - Now for repeat screening. N/A  History of Adenoma - Now for follow-up colonoscopy & has been > or = to 3 yrs.  N/A  Polyps removed today? No Recommend repeat exam, <10 yrs? No ASA CLASS:   Class II INDICATIONS:Treatment of bleeding from such lesions as vascular malformation, ulceration, neoplasia, & polypectomy site. MEDICATIONS: Monitored anesthesia care and Propofol 200 mg IV DESCRIPTION OF PROCEDURE:   After the risks benefits and alternatives of the procedure were thoroughly explained, informed consent was obtained.  The digital rectal exam revealed no abnormalities of the rectum.   The LB PFC-H190 [id number]  endoscope was introduced through the anus and advanced to the cecum, which was identified by both the appendix and ileocecal valve. No adverse events experienced.   The quality of the prep was excellent. (Suprep was used)  The instrument was then slowly withdrawn as the colon was fully examined. Estimated blood loss is zero unless otherwise noted in this procedure report.    COLON FINDINGS: A normal appearing cecum, ileocecal valve, and appendiceal orifice were identified.  The ascending, transverse, descending, sigmoid colon, and rectum appeared unremarkable.  The IC valve, ascending colon, hepatic flexure and proximal transverse examined very carefully several times with no abnormalities found including a retroflexed view of the ascending colon. Retroflexed views revealed no abnormalities. The time to cecum = 1.9 Withdrawal time = 12.5   The  scope was withdrawn and the procedure completed.  COMPLICATIONS: There were no immediate complications.  ENDOSCOPIC IMPRESSION: Normal colonoscopy   RECOMMENDATIONS: You should continue to follow colorectal cancer screening guidelines for "routine risk" patients with a repeat colonoscopy in 10 years. There is no need for FOBT (stool) testing for at least 5 years.  eSigned:  Ladene Artist, MD, [facility] 09/26/2015 8:36 AM   []    PATIENT NAME:  Ashley, Carpenter MR#: [id number]

## 2015-09-26 NOTE — Patient Instructions (Signed)
YOU HAD AN ENDOSCOPIC PROCEDURE TODAY AT [facility]:   Refer to the procedure report that was given to you for any specific questions about what was found during the examination.  If the procedure report does not answer your questions, please call your gastroenterologist to clarify.  If you requested that your care partner not be given the details of your procedure findings, then the procedure report has been included in a sealed envelope for you to review at your convenience later.  YOU SHOULD EXPECT: Some feelings of bloating in the abdomen. Passage of more gas than usual.  Walking can help get rid of the air that was put into your GI tract during the procedure and reduce the bloating. If you had a lower endoscopy (such as a colonoscopy or flexible sigmoidoscopy) you may notice spotting of blood in your stool or on the toilet paper. If you underwent a bowel prep for your procedure, you may not have a normal bowel movement for a few days.  Please Note:  You might notice some irritation and congestion in your nose or some drainage.  This is from the oxygen used during your procedure.  There is no need for concern and it should clear up in a day or so.  SYMPTOMS TO REPORT IMMEDIATELY:   Following lower endoscopy (colonoscopy or flexible sigmoidoscopy):  Excessive amounts of blood in the stool  Significant tenderness or worsening of abdominal pains  Swelling of the abdomen that is new, acute  Fever of 100F or higher   For urgent or emergent issues, a gastroenterologist can be reached at any hour by calling [phone].   DIET: Your first meal following the procedure should be a small meal and then it is ok to progress to your normal diet. Heavy or fried foods are harder to digest and may make you feel nauseous or bloated.  Likewise, meals heavy in dairy and vegetables can increase bloating.  Drink plenty of fluids but you should avoid alcoholic beverages for 24  hours.  ACTIVITY:  You should plan to take it easy for the rest of today and you should NOT DRIVE or use heavy machinery until tomorrow (because of the sedation medicines used during the test).    FOLLOW UP: Our staff will call the number listed on your records the next business day following your procedure to check on you and address any questions or concerns that you may have regarding the information given to you following your procedure. If we do not reach you, we will leave a message.  However, if you are feeling well and you are not experiencing any problems, there is no need to return our call.  We will assume that you have returned to your regular daily activities without incident.  If any biopsies were taken you will be contacted by phone or by letter within the next 1-3 weeks.  Please call us at [phone] if you have not heard about the biopsies in 3 weeks.    SIGNATURES/CONFIDENTIALITY: You and/or your care partner have signed paperwork which will be entered into your electronic medical record.  These signatures attest to the fact that that the information above on your After Visit Summary has been reviewed and is understood.  Full responsibility of the confidentiality of this discharge information lies with you and/or your care-partner.  Please, read discharge instructions.  Thank-you for choosing [place] for your healthcare needs today.

## 2015-09-26 NOTE — Progress Notes (Signed)
Report to PACU, RN, vss, BBS= Clear.  

## 2015-09-29 ENCOUNTER — Telehealth: Payer: Self-pay | Admitting: Gastroenterology

## 2015-09-29 ENCOUNTER — Telehealth: Payer: Self-pay | Admitting: *Deleted

## 2015-09-29 NOTE — Telephone Encounter (Signed)
  Follow up Call-  Call back number 09/26/2015 07/07/2015  Post procedure Call Back phone  # [phone]  Permission to leave phone message Yes Yes     Patient questions:  Do you have a fever, pain , or abdominal swelling? No. Pain Score  0 *  Have you tolerated food without any problems? Yes.    Have you been able to return to your normal activities? Yes.    Do you have any questions about your discharge instructions: Diet   No. Medications  No. Follow up visit  No.  Do you have questions or concerns about your Care? No.  Actions: * If pain score is 4 or above: No action needed, pain <4.

## 2015-09-29 NOTE — Telephone Encounter (Signed)
I spoke with Dr. 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 Her impression was that biopsies primarily showed inflammatory changes however there were 3-4 small areas that appeared to have adenomatous changes.  Given that the lesions resolved completely on follow up colonoscopy the "adenomatous" changes were likely regenerative changes and not neoplastic changes.

## 2015-12-28 ENCOUNTER — Encounter: Payer: Self-pay | Admitting: *Deleted

## 2016-02-27 DIAGNOSIS — F4322 Adjustment disorder with anxiety: Secondary | ICD-10-CM | POA: Diagnosis not present

## 2016-03-09 ENCOUNTER — Encounter: Payer: Self-pay | Admitting: Internal Medicine

## 2016-03-09 ENCOUNTER — Ambulatory Visit (INDEPENDENT_AMBULATORY_CARE_PROVIDER_SITE_OTHER): Payer: BLUE CROSS/BLUE SHIELD | Admitting: Internal Medicine

## 2016-03-09 VITALS — BP 118/64 | HR 74 | Temp 97.9°F | Resp 14 | Ht 64.0 in | Wt 153.0 lb

## 2016-03-09 DIAGNOSIS — Z136 Encounter for screening for cardiovascular disorders: Secondary | ICD-10-CM | POA: Diagnosis not present

## 2016-03-09 DIAGNOSIS — Z13220 Encounter for screening for lipoid disorders: Secondary | ICD-10-CM

## 2016-03-09 DIAGNOSIS — Z0000 Encounter for general adult medical examination without abnormal findings: Secondary | ICD-10-CM

## 2016-03-09 DIAGNOSIS — Z79899 Other long term (current) drug therapy: Secondary | ICD-10-CM

## 2016-03-09 DIAGNOSIS — Z23 Encounter for immunization: Secondary | ICD-10-CM

## 2016-03-09 DIAGNOSIS — Z131 Encounter for screening for diabetes mellitus: Secondary | ICD-10-CM

## 2016-03-09 DIAGNOSIS — E559 Vitamin D deficiency, unspecified: Secondary | ICD-10-CM | POA: Diagnosis not present

## 2016-03-09 DIAGNOSIS — Z130 Encounter for screening for diseases of the blood and blood-forming organs and certain disorders involving the immune mechanism: Secondary | ICD-10-CM

## 2016-03-09 DIAGNOSIS — Z1389 Encounter for screening for other disorder: Secondary | ICD-10-CM

## 2016-03-09 DIAGNOSIS — I10 Essential (primary) hypertension: Secondary | ICD-10-CM

## 2016-03-09 DIAGNOSIS — Z1329 Encounter for screening for other suspected endocrine disorder: Secondary | ICD-10-CM

## 2016-03-09 LAB — CBC WITH DIFFERENTIAL/PLATELET
Basophils Absolute: 68 cells/uL (ref 0–200)
Basophils Relative: 2 %
EOS PCT: 2 %
Eosinophils Absolute: 68 cells/uL (ref 15–500)
HEMATOCRIT: 41.3 % (ref 35.0–45.0)
Hemoglobin: 14 g/dL (ref 11.7–15.5)
LYMPHS PCT: 40 %
Lymphs Abs: 1360 cells/uL (ref 850–3900)
MCH: 31.3 pg (ref 27.0–33.0)
MCHC: 33.9 g/dL (ref 32.0–36.0)
MCV: 92.2 fL (ref 80.0–100.0)
MPV: 9.5 fL (ref 7.5–12.5)
Monocytes Absolute: 306 cells/uL (ref 200–950)
Monocytes Relative: 9 %
NEUTROS PCT: 47 %
Neutro Abs: 1598 cells/uL (ref 1500–7800)
Platelets: 320 10*3/uL (ref 140–400)
RBC: 4.48 MIL/uL (ref 3.80–5.10)
RDW: 12.6 % (ref 11.0–15.0)
WBC: 3.4 10*3/uL — AB (ref 3.8–10.8)

## 2016-03-09 LAB — BASIC METABOLIC PANEL WITH GFR
BUN: 8 mg/dL (ref 7–25)
CALCIUM: 9.9 mg/dL (ref 8.6–10.4)
CO2: 28 mmol/L (ref 20–31)
Chloride: 103 mmol/L (ref 98–110)
Creat: 0.72 mg/dL (ref 0.50–1.05)
GLUCOSE: 82 mg/dL (ref 65–99)
Potassium: 4 mmol/L (ref 3.5–5.3)
SODIUM: 138 mmol/L (ref 135–146)

## 2016-03-09 LAB — TSH: TSH: 1.21 m[IU]/L

## 2016-03-09 LAB — URINALYSIS, ROUTINE W REFLEX MICROSCOPIC
BILIRUBIN URINE: NEGATIVE
Glucose, UA: NEGATIVE
Hgb urine dipstick: NEGATIVE
Ketones, ur: NEGATIVE
LEUKOCYTES UA: NEGATIVE
Nitrite: NEGATIVE
PROTEIN: NEGATIVE
SPECIFIC GRAVITY, URINE: 1.004 (ref 1.001–1.035)
pH: 7.5 (ref 5.0–8.0)

## 2016-03-09 LAB — MICROALBUMIN / CREATININE URINE RATIO: CREATININE, URINE: 14 mg/dL — AB (ref 20–320)

## 2016-03-09 LAB — LIPID PANEL
CHOL/HDL RATIO: 4.1 ratio (ref <=5.0)
Cholesterol: 250 mg/dL — ABNORMAL HIGH (ref 125–200)
HDL: 61 mg/dL (ref >=46)
LDL CALC: 162 mg/dL — AB (ref <130)
Triglycerides: 136 mg/dL (ref <150)
VLDL: 27 mg/dL (ref <30)

## 2016-03-09 LAB — HEPATIC FUNCTION PANEL
ALBUMIN: 4.4 g/dL (ref 3.6–5.1)
ALK PHOS: 75 U/L (ref 33–130)
ALT: 10 U/L (ref 6–29)
AST: 13 U/L (ref 10–35)
BILIRUBIN DIRECT: 0.1 mg/dL (ref <=0.2)
Indirect Bilirubin: 0.5 mg/dL (ref 0.2–1.2)
Total Bilirubin: 0.6 mg/dL (ref 0.2–1.2)
Total Protein: 7 g/dL (ref 6.1–8.1)

## 2016-03-09 LAB — IRON AND TIBC
%SAT: 24 % (ref 11–50)
Iron: 79 ug/dL (ref 45–160)
TIBC: 327 ug/dL (ref 250–450)
UIBC: 248 ug/dL (ref 125–400)

## 2016-03-09 LAB — MAGNESIUM: MAGNESIUM: 1.8 mg/dL (ref 1.5–2.5)

## 2016-03-09 LAB — HEMOGLOBIN A1C
HEMOGLOBIN A1C: 4.9 % (ref <5.7)
MEAN PLASMA GLUCOSE: 94 mg/dL

## 2016-03-09 NOTE — Progress Notes (Signed)
Patient ID: Ashley Carpenter, female   DOB: 1966-02-19, 50 y.o.   MRN: [id number]    Annual Screening Comprehensive Examination   This very nice 50 y.o.female presents for complete physical.  Patient has no major health issues.  Patient reports no complaints at this time.   She reports that she is not taking any medications.  She reports that she eats a very well balanced diet.    She is not exercising as much as she can due to time constraints.    Finally, patient has history of Vitamin D Deficiency and last vitamin D was  Lab Results  Component Value Date   VD25OH 25 (L) 03/10/2015  . She reports that she has not seen obgyn.  She reports that she did ger    No current outpatient prescriptions on file prior to visit.   No current facility-administered medications on file prior to visit.     No Known Allergies  Past Medical History:  Diagnosis Date  . Allergy      There is no immunization history on file for this patient.  Past Surgical History:  Procedure Laterality Date  . CESAREAN SECTION  2002   x 1    Family History  Problem Relation Age of Onset  . Hyperlipidemia Mother   . Cancer Mother     small bowel CA- age 48 diagnosed  . Diabetes Father   . Heart disease Father   . Hypertension Father   . Colon polyps Father     non cancerous  . Cancer Maternal Grandmother   . Heart disease Maternal Grandfather   . Hypertension Maternal Grandfather   . Diabetes Paternal Grandfather   . Esophageal cancer Neg Hx   . Rectal cancer Neg Hx   . Stomach cancer Neg Hx     Social History   Social History  . Marital status: Single    Spouse name: N/A  . Number of children: N/A  . Years of education: N/A   Occupational History  . Not on file.   Social History Main Topics  . Smoking status: Former Smoker    Packs/day: 0.25    Types: Cigarettes    Quit date: 08/10/1983  . Smokeless tobacco: Never Used  . Alcohol use 0.6 oz/week    1 Glasses of wine per week   Comment: occasional  . Drug use: No  . Sexual activity: Yes    Birth control/ protection: Post-menopausal   Other Topics Concern  . Not on file   Social History Narrative  . No narrative on file   Review of Systems  Constitutional: Negative for chills, fever and malaise/fatigue.  HENT: Negative for congestion, ear pain and sore throat.   Eyes: Negative.   Respiratory: Negative for cough, shortness of breath and wheezing.   Cardiovascular: Negative for chest pain, palpitations and leg swelling.  Gastrointestinal: Negative for abdominal pain, blood in stool, constipation, diarrhea, heartburn and melena.  Genitourinary: Negative.   Skin: Negative.   Neurological: Negative for dizziness, sensory change, loss of consciousness and headaches.  Psychiatric/Behavioral: Negative for depression. The patient is not nervous/anxious and does not have insomnia.       Physical Exam  BP 118/64   Pulse 74   Temp 97.9 F (36.6 C) (Temporal)   Resp 14   Ht 5\' 4"  (1.626 m)   Wt 153 lb (69.4 kg)   SpO2 98%   BMI 26.26 kg/m   General Appearance: Well nourished and in no apparent distress. Eyes:  PERRLA, EOMs, conjunctiva no swelling or erythema, normal fundi and vessels. Sinuses: No frontal/maxillary tenderness ENT/Mouth: EACs patent / TMs  nl. Nares clear without erythema, swelling, mucoid exudates. Oral hygiene is good. No erythema, swelling, or exudate. Tongue normal, non-obstructing. Tonsils not swollen or erythematous. Hearing normal.  Neck: Supple, thyroid normal. No bruits, nodes or JVD. Respiratory: Respiratory effort normal.  BS equal and clear bilateral without rales, rhonci, wheezing or stridor. Cardio: Heart sounds are normal with regular rate and rhythm and no murmurs, rubs or gallops. Peripheral pulses are normal and equal bilaterally without edema. No aortic or femoral bruits. Chest: symmetric with normal excursions and percussion. Breasts: Symmetric, without lumps, nipple  discharge, retractions, or fibrocystic changes.  Abdomen: Flat, soft, with bowl sounds. Nontender, no guarding, rebound, hernias, masses, or organomegaly.  Lymphatics: Non tender without lymphadenopathy.   Musculoskeletal: Full ROM all peripheral extremities, joint stability, 5/5 strength, and normal gait. Skin: Warm and dry without rashes, lesions, cyanosis, clubbing or  ecchymosis.  Neuro: Cranial nerves intact, reflexes equal bilaterally. Normal muscle tone, no cerebellar symptoms. Sensation intact.  Pysch: Awake and oriented X 3, normal affect, Insight and Judgment appropriate.   Assessment and Plan   1. Routine general medical examination at a health care facility  - CBC with Differential/Platelet - BASIC METABOLIC PANEL WITH GFR - Hepatic function panel - Magnesium  2. Screening for hyperlipidemia  - Lipid panel  3. Screening for diabetes mellitus  - Hemoglobin A1c - Insulin, random  4. Screening for deficiency anemia  - Iron and TIBC - Vitamin B12  5. Screening for hematuria or proteinuria  - Urinalysis, Routine w reflex microscopic (not at [facility]) - Microalbumin / creatinine urine ratio  6. Screening for cardiovascular condition  - EKG 12-Lead  7. Vitamin D deficiency  - VITAMIN D 25 Hydroxy (Vit-D Deficiency, Fractures)  8. Screening for thyroid disorder  - TSH  9. Need for prophylactic vaccination with combined diphtheria-tetanus-pertussis (DTP) vaccine  - Tdap vaccine greater than or equal to 7yo IM     Continue prudent diet as discussed, weight control, regular exercise, and medications. Routine screening labs and tests as requested with regular follow-up as recommended.  Over 40 minutes of exam, counseling, chart review and critical decision making was performed

## 2016-03-10 LAB — VITAMIN D 25 HYDROXY (VIT D DEFICIENCY, FRACTURES): VIT D 25 HYDROXY: 27 ng/mL — AB (ref 30–100)

## 2016-03-10 LAB — INSULIN, RANDOM: Insulin: 5.3 u[IU]/mL (ref 2.0–19.6)

## 2016-03-10 LAB — VITAMIN B12: VITAMIN B 12: 336 pg/mL (ref 200–1100)

## 2016-07-22 DIAGNOSIS — F4322 Adjustment disorder with anxiety: Secondary | ICD-10-CM | POA: Diagnosis not present

## 2016-09-10 DIAGNOSIS — F4322 Adjustment disorder with anxiety: Secondary | ICD-10-CM | POA: Diagnosis not present

## 2016-09-16 DIAGNOSIS — F4322 Adjustment disorder with anxiety: Secondary | ICD-10-CM | POA: Diagnosis not present

## 2016-12-15 DIAGNOSIS — F4322 Adjustment disorder with anxiety: Secondary | ICD-10-CM | POA: Diagnosis not present

## 2017-01-25 DIAGNOSIS — Q078 Other specified congenital malformations of nervous system: Secondary | ICD-10-CM | POA: Diagnosis not present

## 2017-02-08 DIAGNOSIS — H2511 Age-related nuclear cataract, right eye: Secondary | ICD-10-CM | POA: Diagnosis not present

## 2017-03-10 ENCOUNTER — Encounter: Payer: Self-pay | Admitting: Internal Medicine

## 2017-03-10 DIAGNOSIS — H2522 Age-related cataract, morgagnian type, left eye: Secondary | ICD-10-CM | POA: Diagnosis not present

## 2017-03-10 DIAGNOSIS — H2512 Age-related nuclear cataract, left eye: Secondary | ICD-10-CM | POA: Diagnosis not present

## 2017-03-28 ENCOUNTER — Ambulatory Visit: Payer: Self-pay | Admitting: Internal Medicine

## 2017-03-28 ENCOUNTER — Ambulatory Visit (INDEPENDENT_AMBULATORY_CARE_PROVIDER_SITE_OTHER): Payer: BLUE CROSS/BLUE SHIELD

## 2017-03-28 DIAGNOSIS — A499 Bacterial infection, unspecified: Secondary | ICD-10-CM | POA: Diagnosis not present

## 2017-03-28 DIAGNOSIS — N390 Urinary tract infection, site not specified: Secondary | ICD-10-CM | POA: Diagnosis not present

## 2017-03-28 MED ORDER — NITROFURANTOIN MONOHYD MACRO 100 MG PO CAPS: 100.0000 mg | ORAL_CAPSULE | Freq: Two times a day (BID) | ORAL | 0 refills | Status: DC

## 2017-03-28 NOTE — Progress Notes (Signed)
Pt presents for nurse visit lab only. Sxs include uri. Frequency, urgent, & burning. Thought it was getting better but it wasn't.

## 2017-03-29 LAB — URINALYSIS, ROUTINE W REFLEX MICROSCOPIC
BILIRUBIN URINE: NEGATIVE
Bacteria, UA: NONE SEEN /HPF
Glucose, UA: NEGATIVE
Hgb urine dipstick: NEGATIVE
Hyaline Cast: NONE SEEN /LPF
KETONES UR: NEGATIVE
NITRITE: NEGATIVE
PROTEIN: NEGATIVE
RBC / HPF: NONE SEEN /HPF (ref 0–2)
SPECIFIC GRAVITY, URINE: 1.016 (ref 1.001–1.03)
SQUAMOUS EPITHELIAL / LPF: NONE SEEN /HPF (ref <=5)
pH: <=5 (ref 5.0–8.0)

## 2017-03-30 ENCOUNTER — Other Ambulatory Visit: Payer: Self-pay | Admitting: Physician Assistant

## 2017-03-30 LAB — URINE CULTURE
MICRO NUMBER: 80902135
SPECIMEN QUALITY:: ADEQUATE

## 2017-03-30 MED ORDER — AMOXICILLIN-POT CLAVULANATE 875-125 MG PO TABS: 1.0000 | ORAL_TABLET | Freq: Two times a day (BID) | ORAL | 0 refills | Status: DC

## 2017-03-30 NOTE — Progress Notes (Signed)
Future Appointments Date Time Provider [place]  05/16/2017 3:45 PM Vicie Mutters, PA-C [id number] None

## 2017-03-30 NOTE — Progress Notes (Signed)
Pt aware of lab results & voiced understanding of those results.

## 2017-05-13 DIAGNOSIS — E782 Mixed hyperlipidemia: Secondary | ICD-10-CM | POA: Insufficient documentation

## 2017-05-13 NOTE — Progress Notes (Signed)
Complete Physical  Assessment and Plan: Vitamin D deficiency -     VITAMIN D 25 Hydroxy (Vit-D Deficiency, Fractures)  Medication management -     CBC with Differential/Platelet -     BASIC METABOLIC PANEL WITH GFR -     Hepatic function panel -     Magnesium  Routine general medical examination at a health care facility 1 year   Screening for hematuria or proteinuria -     Urinalysis, Routine w reflex microscopic -     Microalbumin / creatinine urine ratio  Screening for thyroid disorder -     TSH  Screening for cardiovascular condition -     EKG 12-Lead  Mixed hyperlipidemia -     Lipid panel -check lipids, decrease fatty foods, increase activity.   Discussed med's effects and SE's. Screening labs and tests as requested with regular follow-up as recommended. Over 40 minutes of exam, counseling, chart review, and complex, high level critical decision making was performed this visit.   HPI  51 y.o. female  presents for a complete physical and follow up for has Vitamin D deficiency; Medication management; and Mixed hyperlipidemia on her problem list..  Her blood pressure has been controlled at home, today their BP is BP: 118/80 She does workout. She denies chest pain, shortness of breath, dizziness.   She is not on cholesterol medication. Her cholesterol is not at goal. Work in June checked and it was 195 total, trying to move more and work on chol. The cholesterol last visit was:   Lab Results  Component Value Date   CHOL 250 (H) 03/09/2016   HDL 61 03/09/2016   LDLCALC 162 (H) 03/09/2016   TRIG 136 03/09/2016   CHOLHDL 4.1 03/09/2016    Last A1C in the office was:  Lab Results  Component Value Date   HGBA1C 4.9 03/09/2016   Patient is on Vitamin D supplement.   Lab Results  Component Value Date   VD25OH 27 (L) 03/09/2016     BMI is Body mass index is 25.15 kg/m., she is working on diet and exercise. Wt Readings from Last 3 Encounters:  05/16/17 148 lb 12.8  oz (67.5 kg)  03/09/16 153 lb (69.4 kg)  09/26/15 154 lb (69.9 kg)   She has lump on right leg.  She has stress, teenage daughter, driving but not by herself, 59 tomorrow, [name].   Current Medications:  No current outpatient prescriptions on file prior to visit.   No current facility-administered medications on file prior to visit.    Allergies:  No Known Allergies  Medical History:  She has Vitamin D deficiency; Medication management; and Mixed hyperlipidemia on her problem list.  Health Maintenance:   Immunization History  Administered Date(s) Administered  . 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   No LMP recorded. Patient is postmenopausal. Pap: 2015 neg HPV, neg pap due 2020 MGM:10/2015  DEXA: due age 54 Colonoscopy: 09/2015 10 years  Patient Care Team: Unk Pinto, MD as PCP - General (Internal Medicine) System, Pcp Not In  Surgical History:  She has a past surgical history that includes Cesarean section (2002). Family History:  Herfamily history includes Cancer in her maternal grandmother and mother; Colon polyps in her father; Diabetes in her father and paternal grandfather; Heart disease in her father and maternal grandfather; Hyperlipidemia in her mother; Hypertension in her father and maternal grandfather. Social History:  She reports that she quit smoking about 33 years ago. Her smoking use included Cigarettes. She smoked 0.25 packs per day. She has never used smokeless tobacco. She reports that she drinks about 0.6 oz of alcohol per week . She reports that she does not use drugs.  Review of Systems: Review of Systems  Constitutional: Negative.   HENT: Negative.   Eyes: Negative.   Respiratory: Negative.   Cardiovascular: Negative.   Gastrointestinal: Negative.   Genitourinary: Negative.   Musculoskeletal: Negative.   Skin: Negative.     Physical  Exam: Estimated body mass index is 25.15 kg/m as calculated from the following:   Height as of this encounter: 5' 4.5" (1.638 m).   Weight as of this encounter: 148 lb 12.8 oz (67.5 kg). BP 118/80   Pulse 67   Temp (!) 97.5 F (36.4 C)   Resp 18   Ht 5' 4.5" (1.638 m)   Wt 148 lb 12.8 oz (67.5 kg)   SpO2 99%   BMI 25.15 kg/m  General Appearance: Well nourished, in no apparent distress.  Eyes: PERRLA, EOMs, conjunctiva no swelling or erythema, normal fundi and vessels.  Sinuses: No Frontal/maxillary tenderness  ENT/Mouth: Ext aud canals clear, normal light reflex with TMs without erythema, bulging. Good dentition. No erythema, swelling, or exudate on post pharynx. Tonsils not swollen or erythematous. Hearing normal.  Neck: Supple, thyroid normal. No bruits  Respiratory: Respiratory effort normal, BS equal bilaterally without rales, rhonchi, wheezing or stridor.  Cardio: RRR without murmurs, rubs or gallops. Brisk peripheral pulses without edema.  Chest: symmetric, with normal excursions and percussion.  Breasts: Symmetric, without lumps, nipple discharge, retractions.  Abdomen: Soft, nontender, no guarding, rebound, hernias, masses, or organomegaly.  Lymphatics: Non tender without lymphadenopathy.  Genitourinary: defer 2020 Musculoskeletal: Full ROM all peripheral extremities,5/5 strength, and normal gait.  Skin: lipoma right anterior medial thigh. Warm, dry without rashes, lesions, ecchymosis. Neuro: Cranial nerves intact, reflexes equal bilaterally. Normal muscle tone, no cerebellar symptoms. Sensation intact.  Psych: Awake and oriented X 3, normal affect, Insight and Judgment appropriate.   EKG: WNL no ST changes. AORTA SCAN: defer  Vicie Mutters 4:11 PM [facility]

## 2017-05-16 ENCOUNTER — Ambulatory Visit (INDEPENDENT_AMBULATORY_CARE_PROVIDER_SITE_OTHER): Payer: BLUE CROSS/BLUE SHIELD | Admitting: Physician Assistant

## 2017-05-16 ENCOUNTER — Encounter: Payer: Self-pay | Admitting: Physician Assistant

## 2017-05-16 VITALS — BP 118/80 | HR 67 | Temp 97.5°F | Resp 18 | Ht 64.5 in | Wt 148.8 lb

## 2017-05-16 DIAGNOSIS — I10 Essential (primary) hypertension: Secondary | ICD-10-CM | POA: Diagnosis not present

## 2017-05-16 DIAGNOSIS — Z0000 Encounter for general adult medical examination without abnormal findings: Secondary | ICD-10-CM | POA: Diagnosis not present

## 2017-05-16 DIAGNOSIS — E782 Mixed hyperlipidemia: Secondary | ICD-10-CM

## 2017-05-16 DIAGNOSIS — Z1389 Encounter for screening for other disorder: Secondary | ICD-10-CM

## 2017-05-16 DIAGNOSIS — Z130 Encounter for screening for diseases of the blood and blood-forming organs and certain disorders involving the immune mechanism: Secondary | ICD-10-CM

## 2017-05-16 DIAGNOSIS — E559 Vitamin D deficiency, unspecified: Secondary | ICD-10-CM | POA: Diagnosis not present

## 2017-05-16 DIAGNOSIS — Z1329 Encounter for screening for other suspected endocrine disorder: Secondary | ICD-10-CM

## 2017-05-16 DIAGNOSIS — Z79899 Other long term (current) drug therapy: Secondary | ICD-10-CM

## 2017-05-16 DIAGNOSIS — Z136 Encounter for screening for cardiovascular disorders: Secondary | ICD-10-CM | POA: Diagnosis not present

## 2017-05-16 NOTE — Patient Instructions (Addendum)
Lipoma A lipoma is a noncancerous (benign) tumor that is made up of fat cells. This is a very common type of soft-tissue growth. Lipomas are usually found under the skin (subcutaneous). They may occur in any tissue of the body that contains fat. Common areas for lipomas to appear include the back, shoulders, buttocks, and thighs. Lipomas grow slowly, and they are usually painless. Most lipomas do not cause problems and do not require treatment. What are the causes? The cause of this condition is not known. What increases the risk? This condition is more likely to develop in:  People who are 78-74 years old.  People who have a family history of lipomas.  What are the signs or symptoms? A lipoma usually appears as a small, round bump under the skin. It may feel soft or rubbery, but the firmness can vary. Most lipomas are not painful. However, a lipoma may become painful if it is located in an area where it pushes on nerves. How is this diagnosed? A lipoma can usually be diagnosed with a physical exam. You may also have tests to confirm the diagnosis and to rule out other conditions. Tests may include:  Imaging tests, such as a CT scan or MRI.  Removal of a tissue sample to be looked at under a microscope (biopsy).  How is this treated? Treatment is not needed for small lipomas that are not causing problems. If a lipoma continues to get bigger or it causes problems, removal is often the best option. Lipomas can also be removed to improve appearance. Removal of a lipoma is usually done with a surgery in which the fatty cells and the surrounding capsule are removed. Most often, a medicine that numbs the area (local anesthetic) is used for this procedure. Follow these instructions at home:  Keep all follow-up visits as directed by your health care provider. This is important. Contact a health care provider if:  Your lipoma becomes larger or hard.  Your lipoma becomes painful, red, or  increasingly swollen. These could be signs of infection or a more serious condition. This information is not intended to replace advice given to you by your health care provider. Make sure you discuss any questions you have with your health care provider. Document Released: 07/16/2002 Document Revised: 01/01/2016 Document Reviewed: 07/22/2014 Elsevier Interactive Patient Education  2018 [employer].    Epidermal Cyst An epidermal cyst is a small, painless lump under your skin. It may be called an epidermal inclusion cyst or an infundibular cyst. The cyst contains a grayish-white, bad-smelling substance (keratin). It is important not to pop epidermal cysts yourself. These cysts are usually harmless (benign), but they can get infected. Symptoms of infection may include:  Redness.  Inflammation.  Tenderness.  Warmth.  Fever.  A grayish-white, bad-smelling substance draining from the cyst.  Pus draining from the cyst.  Follow these instructions at home:  Take over-the-counter and prescription medicines only as told by your doctor.  If you were prescribed an antibiotic, use it as told by your doctor. Do not stop using the antibiotic even if you start to feel better.  Keep the area around your cyst clean and dry.  Wear loose, dry clothing.  Do not try to pop your cyst.  Avoid touching your cyst.  Check your cyst every day for signs of infection.  Keep all follow-up visits as told by your doctor. This is important. How is this prevented?  Wear clean, dry, clothing.  Avoid wearing tight clothing.  Keep your skin clean and dry. Shower or take baths every day.  Wash your body with a benzoyl peroxide wash when you shower or bathe. Contact a health care provider if:  Your cyst has symptoms of infection.  Your condition is not improving or is getting worse.  You have a cyst that looks different from other cysts you have had.  You have a fever. Get help right away  if:  Redness spreads from the cyst into the surrounding area. This information is not intended to replace advice given to you by your health care provider. Make sure you discuss any questions you have with your health care provider. Document Released: 09/02/2004 Document Revised: 03/24/2016 Document Reviewed: 05/28/2015 Elsevier Interactive Patient Education  2018 [employer].   Vitamin D goal is between 60-80  Please make sure that you are taking your Vitamin D as directed.   It is very important as a natural anti-inflammatory   helping hair, skin, and nails, as well as reducing stroke and heart attack risk.   It helps your bones and helps with mood.  We want you on at least 5000 IU daily  It also decreases numerous cancer risks so please take it as directed.   Low Vit D is associated with a 200-300% higher risk for CANCER   and 200-300% higher risk for HEART   ATTACK  &  STROKE.    .....................................Marland Kitchen  It is also associated with higher death rate at younger ages,   autoimmune diseases like Rheumatoid arthritis, Lupus, Multiple Sclerosis.     Also many other serious conditions, like depression, Alzheimer's  Dementia, infertility, muscle aches, fatigue, fibromyalgia - just to name a few.  +++++++++++++++++++  Can get liquid vitamin D from [place] here in [place] at  [facility] alternatives [address] [place], [place] [address] Or you can try earth fare

## 2017-05-17 LAB — URINALYSIS, ROUTINE W REFLEX MICROSCOPIC
Bacteria, UA: NONE SEEN /HPF
Bilirubin Urine: NEGATIVE
Glucose, UA: NEGATIVE
Hgb urine dipstick: NEGATIVE
Hyaline Cast: NONE SEEN /LPF
Ketones, ur: NEGATIVE
NITRITE: NEGATIVE
PROTEIN: NEGATIVE
RBC / HPF: NONE SEEN /HPF (ref 0–2)
Specific Gravity, Urine: 1.008 (ref 1.001–1.03)
Squamous Epithelial / LPF: NONE SEEN /HPF (ref <=5)
pH: <=5 (ref 5.0–8.0)

## 2017-05-17 LAB — CBC WITH DIFFERENTIAL/PLATELET
BASOS PCT: 1.3 %
Basophils Absolute: 59 cells/uL (ref 0–200)
Eosinophils Absolute: 59 cells/uL (ref 15–500)
Eosinophils Relative: 1.3 %
HCT: 39.9 % (ref 35.0–45.0)
Hemoglobin: 13.7 g/dL (ref 11.7–15.5)
LYMPHS ABS: 1737 {cells}/uL (ref 850–3900)
MCH: 31.4 pg (ref 27.0–33.0)
MCHC: 34.3 g/dL (ref 32.0–36.0)
MCV: 91.5 fL (ref 80.0–100.0)
MPV: 9.9 fL (ref 7.5–12.5)
Monocytes Relative: 8.7 %
NEUTROS PCT: 50.1 %
Neutro Abs: 2255 cells/uL (ref 1500–7800)
Platelets: 316 10*3/uL (ref 140–400)
RBC: 4.36 10*6/uL (ref 3.80–5.10)
RDW: 11.8 % (ref 11.0–15.0)
Total Lymphocyte: 38.6 %
WBC: 4.5 10*3/uL (ref 3.8–10.8)
WBCMIX: 392 {cells}/uL (ref 200–950)

## 2017-05-17 LAB — BASIC METABOLIC PANEL WITH GFR
BUN: 16 mg/dL (ref 7–25)
CO2: 28 mmol/L (ref 20–32)
CREATININE: 0.84 mg/dL (ref 0.50–1.05)
Calcium: 9.9 mg/dL (ref 8.6–10.4)
Chloride: 102 mmol/L (ref 98–110)
GFR, EST NON AFRICAN AMERICAN: 80 mL/min/{1.73_m2} (ref >=60)
GFR, Est African American: 93 mL/min/{1.73_m2} (ref >=60)
Glucose, Bld: 83 mg/dL (ref 65–99)
Potassium: 3.5 mmol/L (ref 3.5–5.3)
SODIUM: 139 mmol/L (ref 135–146)

## 2017-05-17 LAB — LIPID PANEL
CHOLESTEROL: 251 mg/dL — AB (ref <200)
HDL: 57 mg/dL (ref >50)
LDL Cholesterol (Calc): 167 mg/dL (calc) — ABNORMAL HIGH
NON-HDL CHOLESTEROL (CALC): 194 mg/dL — AB (ref <130)
Total CHOL/HDL Ratio: 4.4 (calc) (ref <5.0)
Triglycerides: 132 mg/dL (ref <150)

## 2017-05-17 LAB — HEPATIC FUNCTION PANEL
AG RATIO: 1.6 (calc) (ref 1.0–2.5)
ALKALINE PHOSPHATASE (APISO): 75 U/L (ref 33–130)
ALT: 12 U/L (ref 6–29)
AST: 14 U/L (ref 10–35)
Albumin: 4.4 g/dL (ref 3.6–5.1)
Bilirubin, Direct: 0.1 mg/dL (ref 0.0–0.2)
GLOBULIN: 2.7 g/dL (ref 1.9–3.7)
Indirect Bilirubin: 0.4 mg/dL (calc) (ref 0.2–1.2)
TOTAL PROTEIN: 7.1 g/dL (ref 6.1–8.1)
Total Bilirubin: 0.5 mg/dL (ref 0.2–1.2)

## 2017-05-17 LAB — TSH: TSH: 1.53 mIU/L

## 2017-05-17 LAB — MICROALBUMIN / CREATININE URINE RATIO: Creatinine, Urine: 33 mg/dL (ref 20–275)

## 2017-05-17 LAB — MICROSCOPIC MESSAGE

## 2017-05-17 LAB — MAGNESIUM: Magnesium: 1.8 mg/dL (ref 1.5–2.5)

## 2017-05-17 LAB — VITAMIN D 25 HYDROXY (VIT D DEFICIENCY, FRACTURES): Vit D, 25-Hydroxy: 26 ng/mL — ABNORMAL LOW (ref 30–100)

## 2017-05-18 NOTE — Progress Notes (Signed)
Pt aware of lab results & voiced understanding of those results.

## 2017-06-21 DIAGNOSIS — Z1231 Encounter for screening mammogram for malignant neoplasm of breast: Secondary | ICD-10-CM | POA: Diagnosis not present

## 2017-06-21 LAB — HM MAMMOGRAPHY

## 2017-06-28 ENCOUNTER — Encounter: Payer: Self-pay | Admitting: Internal Medicine

## 2017-06-28 ENCOUNTER — Ambulatory Visit: Payer: BLUE CROSS/BLUE SHIELD | Admitting: Internal Medicine

## 2017-06-28 VITALS — BP 136/74 | HR 86 | Temp 97.3°F | Resp 18 | Ht 64.5 in | Wt 148.6 lb

## 2017-06-28 DIAGNOSIS — M5481 Occipital neuralgia: Secondary | ICD-10-CM | POA: Diagnosis not present

## 2017-06-28 MED ORDER — HYDROCODONE-ACETAMINOPHEN 5-300 MG PO TABS: ORAL_TABLET | ORAL | 0 refills | Status: DC

## 2017-06-28 MED ORDER — CYCLOBENZAPRINE HCL 10 MG PO TABS: ORAL_TABLET | ORAL | 0 refills | Status: DC

## 2017-06-28 MED ORDER — PREDNISONE 20 MG PO TABS: ORAL_TABLET | ORAL | 0 refills | Status: DC

## 2017-06-28 NOTE — Patient Instructions (Signed)
Occipital Neuralgia Occipital neuralgia is a type of headache that causes episodes of very bad pain in the back of your head. Pain from occipital neuralgia may spread (radiate) to other parts of your head. The pain is usually brief and often goes away after you rest and relax. These headaches may be caused by irritation of the nerves that leave your spinal cord high up in your neck, just below the base of your skull (occipital nerves). Your occipital nerves transmit sensations from the back of your head, the top of your head, and the areas behind your ears. What are the causes? Occipital neuralgia can occur without any known cause (primary headache syndrome). In other cases, occipital neuralgia is caused by pressure on or irritation of one of the two occipital nerves. Causes of occipital nerve compression or irritation include:  Wear and tear of the vertebrae in the neck (osteoarthritis).  Neck injury.  Disease of the disks that separate the vertebrae.  Tumors.  Gout.  Infections.  Diabetes.  Swollen blood vessels that put pressure on the occipital nerves.  Muscle spasm in the neck.  What are the signs or symptoms? Pain is the main symptom of occipital neuralgia. It usually starts in the back of the head but may also be felt in other areas supplied by the occipital nerves. Pain is usually on one side but may be on both sides. You may have:  Brief episodes of very bad pain that is burning, stabbing, shocking, or shooting.  Pain behind the eye.  Pain triggered by neck movement or hair brushing.  Scalp tenderness.  Aching in the back of the head between episodes of very bad pain.  How is this diagnosed? Your health care provider may diagnose occipital neuralgia based on your symptoms and a physical exam. During the exam, the health care provider may push on areas supplied by the occipital nerves to see if they are painful. Some tests may also be done to help in making the  diagnosis. These may include:  Imaging studies of the upper spinal cord, such as an MRI or CT scan. These may show compression or spinal cord abnormalities.  Nerve block. You will get an injection of numbing medicine (local anesthetic) near the occipital nerve to see if this relieves pain.  How is this treated? Treatment may begin with simple measures, such as:  Rest.  Massage.  Heat.  Over-the-counter pain relievers.  If these measures do not work, you may need other treatments, including:  Medicines such as: ? Prescription-strength anti-inflammatory medicines. ? Muscle relaxants. ? Antiseizure medicines. ? Antidepressants.  Steroid injection. This involves injections of local anesthetic and strong anti-inflammatory drugs (steroids).  Pulsed radiofrequency. Wires are implanted to deliver electrical impulses that block pain signals from the occipital nerve.  Physical therapy.  Surgery to relieve nerve pressure.  Follow these instructions at home:  Take all medicines as directed by your health care provider.  Avoid activities that cause pain.  Rest when you have an attack of pain.  Try gentle massage or a heating pad to relieve pain.  Work with a physical therapist to learn stretching exercises you can do at home.  Try a different pillow or sleeping position.  Practice good posture.  Try to stay active. Get regular exercise that does not cause pain. Ask your health care provider to suggest safe exercises for you.  Keep all follow-up visits as directed by your health care provider. This is important. Contact a health care provider if:    Your medicine is not working.  You have new or worsening symptoms. Get help right away if:  You have very bad head pain that is not going away.  You have a sudden change in vision, balance, or speech. This information is not intended to replace advice given to you by your health care provider. Make sure you discuss any  questions you have with your health care provider. Document Released: 07/20/2001 Document Revised: 01/01/2016 Document Reviewed: 07/18/2013 Elsevier Interactive Patient Education  2017 Elsevier Inc.  

## 2017-06-28 NOTE — Progress Notes (Signed)
  Subjective:    Patient ID: Ashley Carpenter, female    DOB: [birth year], 51 y.o.   MRN: [id number]  HPI This very nice 51 yo MWF with a neg PMH present with awakening this am with a severe sharp pain in the Rt occiput/mastoid area. Denies any injury or neuro signs or sx's. Reports pain with neck ROM . No Radicular sx's.   No outpatient medications prior to visit.   No Known Allergies  Past Medical History:  Diagnosis Date  . Allergy    Systems Review - 10 point systems review negative except as above.    Objective:   Physical Exam   BP 136/74   Pulse 86   Temp (!) 97.3 F (36.3 C)   Resp 18   Ht 5' 4.5" (1.638 m)   Wt 148 lb 9.6 oz (67.4 kg)   SpO2 98%   BMI 25.11 kg/m   HEENT - WNL. Neck -  straightening of usual Cx lordosis. No trigger point tenderness to correlate with her descriptor of pain location. Pain exacerbates with all Cx ROM including lat rotation, bending and flexion/extension.   Chest - Clear equal BS. Cor - Nl HS. RRR w/o sig MGR. PP 1(+). No edema. MS- FROM w/o deformities.  Gait Nl. Neuro -  Nl w/o focal abnormalities. All Extremity DTR's are brisk/equal.    Assessment & Plan:   1. Occipital neuralgia of right side  - predniSONE (DELTASONE) 20 MG tablet; 1 tab 3 x day for 3 days, then 1 tab 2 x day for 3 days, then 1 tab 1 x day for 5 days  Dispense: 20 tablet; Refill: 0  - cyclobenzaprine (FLEXERIL) 10 MG tablet; Take 1/2 to 1 tablet 2 to 3 x / day for muscle spasm  Dispense: 30 tablet; Refill: 0  - Hydrocodone-Acetaminophen (VICODIN) 5-300 MG TABS; Take  1/2 to 1 tablet every 4 hours as needed for severe pain  Dispense: 30 each; Refill: 0  - discussed meds/SE's. Recc alt heat & ice paks.   - Advised call or return of sx's change

## 2017-07-06 ENCOUNTER — Other Ambulatory Visit: Payer: Self-pay | Admitting: Internal Medicine

## 2017-07-06 DIAGNOSIS — M5020 Other cervical disc displacement, unspecified cervical region: Secondary | ICD-10-CM

## 2017-07-06 DIAGNOSIS — M5481 Occipital neuralgia: Secondary | ICD-10-CM

## 2017-07-07 ENCOUNTER — Encounter: Payer: Self-pay | Admitting: Internal Medicine

## 2017-07-09 ENCOUNTER — Ambulatory Visit
Admission: RE | Admit: 2017-07-09 | Discharge: 2017-07-09 | Disposition: A | Discharge status: Home / Self Care | Payer: BLUE CROSS/BLUE SHIELD | Source: Ambulatory Visit | Attending: Internal Medicine | Admitting: Internal Medicine

## 2017-07-09 ENCOUNTER — Other Ambulatory Visit: Payer: Self-pay | Admitting: Internal Medicine

## 2017-07-09 DIAGNOSIS — M5481 Occipital neuralgia: Secondary | ICD-10-CM

## 2017-07-09 DIAGNOSIS — M5412 Radiculopathy, cervical region: Secondary | ICD-10-CM

## 2017-07-09 DIAGNOSIS — M5020 Other cervical disc displacement, unspecified cervical region: Secondary | ICD-10-CM

## 2017-07-09 HISTORY — DX: Radiculopathy, cervical region: M54.12

## 2017-07-09 HISTORY — DX: Occipital neuralgia: M54.81

## 2017-07-11 ENCOUNTER — Encounter: Payer: Self-pay | Admitting: Internal Medicine

## 2017-07-13 ENCOUNTER — Encounter: Payer: Self-pay | Admitting: *Deleted

## 2017-07-22 DIAGNOSIS — M542 Cervicalgia: Secondary | ICD-10-CM | POA: Diagnosis not present

## 2017-08-05 DIAGNOSIS — M50821 Other cervical disc disorders at C4-C5 level: Secondary | ICD-10-CM | POA: Diagnosis not present

## 2017-08-05 DIAGNOSIS — M40202 Unspecified kyphosis, cervical region: Secondary | ICD-10-CM | POA: Diagnosis not present

## 2017-08-05 DIAGNOSIS — M542 Cervicalgia: Secondary | ICD-10-CM | POA: Diagnosis not present

## 2017-08-09 HISTORY — PX: CATARACT EXTRACTION: SUR2

## 2018-05-25 NOTE — Progress Notes (Signed)
Complete Physical  Assessment and Plan: Vitamin D deficiency -     VITAMIN D 25 Hydroxy (Vit-D Deficiency, Fractures)  Medication management -     CBC with Differential/Platelet -     BASIC METABOLIC PANEL WITH GFR -     Hepatic function panel -     Magnesium  Routine general medical examination at a health care facility 1 year  Get MGM and 3D MGM due to dense breast- no family history, no need for breast MRI at this time.   Screening for hematuria or proteinuria -     Urinalysis, Routine w reflex microscopic -     Microalbumin / creatinine urine ratio  Screening for thyroid disorder -     TSH  Mixed hyperlipidemia -    Will do advanced lipid due to family history and elevated Chol -check lipids, decrease fatty foods, increase activity.   Discussed med's effects and SE's. Screening labs and tests as requested with regular follow-up as recommended. Over 40 minutes of exam, counseling, chart review, and complex, high level critical decision making was performed this visit.   HPI  52 y.o. female  presents for a complete physical and follow up for has Vitamin D deficiency; Medication management; Mixed hyperlipidemia; Cervical radiculitis; and Occipital neuralgia of right side on their problem list..  Her blood pressure has been controlled at home, today their BP is BP: 126/86 She does workout. She denies chest pain, shortness of breath, dizziness.   She is not on cholesterol medication. Her cholesterol is not at goal. She is trying to move more. History of dad with MI in 15's, PGM bypass in her 74's, both smokers. The cholesterol last visit was:   Lab Results  Component Value Date   CHOL 251 (H) 05/16/2017   HDL 57 05/16/2017   LDLCALC 167 (H) 05/16/2017   TRIG 132 05/16/2017   CHOLHDL 4.4 05/16/2017    Last A1C in the office was:  Lab Results  Component Value Date   HGBA1C 4.9 03/09/2016   Patient is on Vitamin D supplement.   Lab Results  Component Value Date    VD25OH 26 (L) 05/16/2017     BMI is Body mass index is 26.73 kg/m., she is working on diet and exercise. Wt Readings from Last 3 Encounters:  05/30/18 160 lb 9.6 oz (72.8 kg)  06/28/17 148 lb 9.6 oz (67.4 kg)  05/16/17 148 lb 12.8 oz (67.5 kg)   She has stress, teenage daughter, driving but not by herself, 53 tomorrow, [name].   Current Medications:  No current outpatient medications on file prior to visit.   No current facility-administered medications on file prior to visit.    Allergies:  No Known Allergies  Medical History:  She has Vitamin D deficiency; Medication management; Mixed hyperlipidemia; Cervical radiculitis; and Occipital neuralgia of right side on their problem list.  Health Maintenance:   Immunization History  Administered Date(s) Administered  . 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   No LMP recorded. Patient is postmenopausal. Pap: 2015 neg HPV, neg pap due 2020 MGM:10/2015  DEXA: due age 69 Colonoscopy: 09/2015 10 years  Patient Care Team: Unk Pinto, MD as PCP - General (Internal Medicine) System, Pcp Not In  Surgical History:  She has a past surgical history that includes Cesarean section (2002). Family History:  Herfamily history includes Cancer in her maternal grandmother and mother; Colon polyps  in her father; Diabetes in her father and paternal grandfather; Heart disease in her father and maternal grandfather; Hyperlipidemia in her mother; Hypertension in her father and maternal grandfather. Social History:  She reports that she quit smoking about 34 years ago. Her smoking use included cigarettes. She smoked 0.25 packs per day. She has never used smokeless tobacco. She reports that she drinks about 1.0 standard drinks of alcohol per week. She reports that she does not use drugs.  Review of Systems: Review of Systems  Constitutional:  Negative.   HENT: Negative.   Eyes: Negative.   Respiratory: Negative.   Cardiovascular: Negative.   Gastrointestinal: Negative.   Genitourinary: Negative.   Musculoskeletal: Negative.   Skin: Negative.     Physical Exam: Estimated body mass index is 26.73 kg/m as calculated from the following:   Height as of this encounter: 5\' 5"  (1.651 m).   Weight as of this encounter: 160 lb 9.6 oz (72.8 kg). BP 126/86   Pulse 68   Temp 98.1 F (36.7 C)   Resp 16   Ht 5\' 5"  (1.651 m)   Wt 160 lb 9.6 oz (72.8 kg)   SpO2 98%   BMI 26.73 kg/m  General Appearance: Well nourished, in no apparent distress.  Eyes: PERRLA, EOMs, conjunctiva no swelling or erythema, normal fundi and vessels.  Sinuses: No Frontal/maxillary tenderness  ENT/Mouth: Ext aud canals clear, normal light reflex with TMs without erythema, bulging. Good dentition. No erythema, swelling, or exudate on post pharynx. Tonsils not swollen or erythematous. Hearing normal.  Neck: Supple, thyroid normal. No bruits  Respiratory: Respiratory effort normal, BS equal bilaterally without rales, rhonchi, wheezing or stridor.  Cardio: RRR without murmurs, rubs or gallops. Brisk peripheral pulses without edema.  Chest: symmetric, with normal excursions and percussion.  Breasts: Symmetric, without lumps, nipple discharge, retractions.  Abdomen: Soft, nontender, no guarding, rebound, hernias, masses, or organomegaly.  Lymphatics: Non tender without lymphadenopathy.  Genitourinary: defer 2020 Musculoskeletal: Full ROM all peripheral extremities,5/5 strength, and normal gait.  Skin: lipoma right anterior medial thigh. Warm, dry without rashes, lesions, ecchymosis. Neuro: Cranial nerves intact, reflexes equal bilaterally. Normal muscle tone, no cerebellar symptoms. Sensation intact.  Psych: Awake and oriented X 3, normal affect, Insight and Judgment appropriate.   EKG: WNL no ST changes. AORTA SCAN: defer  Vicie Mutters 3:27  PM [facility]

## 2018-05-30 ENCOUNTER — Encounter: Payer: Self-pay | Admitting: Physician Assistant

## 2018-05-30 ENCOUNTER — Ambulatory Visit: Payer: BLUE CROSS/BLUE SHIELD | Admitting: Physician Assistant

## 2018-05-30 ENCOUNTER — Other Ambulatory Visit: Payer: Self-pay | Admitting: Physician Assistant

## 2018-05-30 VITALS — BP 126/86 | HR 68 | Temp 98.1°F | Resp 16 | Ht 65.0 in | Wt 160.6 lb

## 2018-05-30 DIAGNOSIS — E559 Vitamin D deficiency, unspecified: Secondary | ICD-10-CM

## 2018-05-30 DIAGNOSIS — Z1329 Encounter for screening for other suspected endocrine disorder: Secondary | ICD-10-CM | POA: Diagnosis not present

## 2018-05-30 DIAGNOSIS — Z13220 Encounter for screening for lipoid disorders: Secondary | ICD-10-CM | POA: Diagnosis not present

## 2018-05-30 DIAGNOSIS — Z0000 Encounter for general adult medical examination without abnormal findings: Secondary | ICD-10-CM | POA: Diagnosis not present

## 2018-05-30 DIAGNOSIS — Z79899 Other long term (current) drug therapy: Secondary | ICD-10-CM | POA: Diagnosis not present

## 2018-05-30 DIAGNOSIS — M5412 Radiculopathy, cervical region: Secondary | ICD-10-CM

## 2018-05-30 DIAGNOSIS — Z130 Encounter for screening for diseases of the blood and blood-forming organs and certain disorders involving the immune mechanism: Secondary | ICD-10-CM

## 2018-05-30 DIAGNOSIS — M5481 Occipital neuralgia: Secondary | ICD-10-CM

## 2018-05-30 DIAGNOSIS — E782 Mixed hyperlipidemia: Secondary | ICD-10-CM | POA: Diagnosis not present

## 2018-05-30 DIAGNOSIS — Z1389 Encounter for screening for other disorder: Secondary | ICD-10-CM | POA: Diagnosis not present

## 2018-05-30 DIAGNOSIS — Z1231 Encounter for screening mammogram for malignant neoplasm of breast: Secondary | ICD-10-CM

## 2018-05-30 NOTE — Patient Instructions (Addendum)
HOW TO SCHEDULE A MAMMOGRAM  [facility]  7 a.m.-6:30 p.m., Monday 7 a.m.-5 p.m., Tuesday-Friday Schedule an appointment by calling [phone].  Encourage you to get the 3D Mammogram- just ask for the 3D MGM  The 3D Mammogram is much more specific and sensitive to pick up breast cancer. For women with fibrocystic breast or lumpy breast it can be hard to determine if it is cancer or not but the 3D mammogram is able to tell this difference which cuts back on unneeded additional tests or scary call backs.   - over 40% increase in detection of breast cancer - over 40% reduction in false positives.  - fewer call backs - reduced anxiety - improved outcomes - [facility]  VITAMIN D IS IMPORTANT  Vitamin D goal is between 60-80  Please make sure that you are taking your Vitamin D as directed.   It is very important as a natural anti-inflammatory   helping hair, skin, and nails, as well as reducing stroke and heart attack risk.   It helps your bones and helps with mood.  We want you on at least 5000 IU daily  It also decreases numerous cancer risks so please take it as directed.   Low Vit D is associated with a 200-300% higher risk for CANCER   and 200-300% higher risk for HEART   ATTACK  &  STROKE.    .....................................Marland Kitchen  It is also associated with higher death rate at younger ages,   autoimmune diseases like Rheumatoid arthritis, Lupus, Multiple Sclerosis.     Also many other serious conditions, like depression, Alzheimer's  Dementia, infertility, muscle aches, fatigue, fibromyalgia - just to name a few.  +++++++++++++++++++  Can get liquid vitamin D from [place] here in [place] at  [facility] alternatives [address], [place], [place] [address] Or you can try earth fare   Your LDL could improve, ideally we want it under a 100.  Your LDL is the bad cholesterol that can lead to heart attack and stroke. To lower your  number you can decrease your fatty foods, red meat, cheese, milk and increase fiber like whole grains and veggies. You can also add a fiber supplement like Citracel or Benefiber, these do not cause gas and bloating and are safe to use. Especially if you have a strong family history of heart disease or stroke or you have evidence of plaque on any imaging like a chest xray, we may discuss at your next office visit putting you on a medication to get your number below 100.   Statin therapy  In addition to the known lipid-lowering effects, statins are now widely accepted to have anti-inflammatory and immunomodulatory effects. Adjunctive use of statins has proven beneficial in the context of a wide range of inflammatory diseases, including rheumatoid arthritis.  Research has shown that the salutary effect of statins on cholesterol may not be their only benefit. Statin therapy has shown promise for everything from fighting viral infections to protecting the eye from cataracts.  A 2005 study of more than 700 hospital patients being treated for pneumonia found that the death rate was more than twice as high among those who were not using statins. In 2006, a [place] study examined the rate of sepsis, a deadly blood infection, among patients who had been hospitalized for heart events. In the two years after their hospitalization, the statin users had a rate of sepsis 19% lower than that of the non-statin  users. A 2009 review of 22 studies found that statins appeared to have a beneficial effect on the outcome of infection, but they couldn't come to a firm conclusion.

## 2018-05-31 NOTE — Progress Notes (Signed)
Done

## 2018-06-09 LAB — MICROALBUMIN / CREATININE URINE RATIO
CREATININE, URINE: 50 mg/dL (ref 20–275)
Microalb Creat Ratio: 4 mcg/mg creat (ref <30)
Microalb, Ur: 0.2 mg/dL

## 2018-06-09 LAB — CBC WITH DIFFERENTIAL/PLATELET
Basophils Absolute: 48 cells/uL (ref 0–200)
Basophils Relative: 1 %
EOS ABS: 82 {cells}/uL (ref 15–500)
Eosinophils Relative: 1.7 %
HCT: 39.2 % (ref 35.0–45.0)
Hemoglobin: 13.4 g/dL (ref 11.7–15.5)
Lymphs Abs: 1565 cells/uL (ref 850–3900)
MCH: 30.9 pg (ref 27.0–33.0)
MCHC: 34.2 g/dL (ref 32.0–36.0)
MCV: 90.3 fL (ref 80.0–100.0)
MONOS PCT: 8.6 %
MPV: 9.9 fL (ref 7.5–12.5)
NEUTROS ABS: 2693 {cells}/uL (ref 1500–7800)
NEUTROS PCT: 56.1 %
PLATELETS: 326 10*3/uL (ref 140–400)
RBC: 4.34 10*6/uL (ref 3.80–5.10)
RDW: 12 % (ref 11.0–15.0)
Total Lymphocyte: 32.6 %
WBC: 4.8 10*3/uL (ref 3.8–10.8)
WBCMIX: 413 {cells}/uL (ref 200–950)

## 2018-06-09 LAB — URINALYSIS, ROUTINE W REFLEX MICROSCOPIC
BACTERIA UA: NONE SEEN /HPF
BILIRUBIN URINE: NEGATIVE
GLUCOSE, UA: NEGATIVE
HYALINE CAST: NONE SEEN /LPF
Hgb urine dipstick: NEGATIVE
KETONES UR: NEGATIVE
Nitrite: NEGATIVE
PROTEIN: NEGATIVE
RBC / HPF: NONE SEEN /HPF (ref 0–2)
Specific Gravity, Urine: 1.009 (ref 1.001–1.03)
pH: 7.5 (ref 5.0–8.0)

## 2018-06-09 LAB — CARDIO IQ(R) ADVANCED LIPID PANEL
APOLIPOPROTEIN (CARDIO IQ ADV LIPID PANEL): 116 mg/dL — AB
Cholesterol: 256 mg/dL — ABNORMAL HIGH (ref <200)
HDL: 57 mg/dL (ref >=50)
LDL Cholesterol (Calc): 165 mg/dL (calc) — ABNORMAL HIGH (ref <100)
LDL Large: 6109 nmol/L (ref 3966–11938)
LDL Medium: 327 nmol/L (ref 122–498)
LDL PEAK SIZE: 221.7 Angstrom (ref >=217)
LDL Particle Number: 1476 nmol/L (ref 732–2035)
LDL Small: 180 nmol/L (ref 75–452)
LIPOPROTEIN (A) (CARDIO IQ ADV LIPID PANEL): 187 nmol/L — AB (ref <75)
Non-HDL Cholesterol (Calc): 199 mg/dL (calc) — ABNORMAL HIGH (ref <130)
TRIGLYCERIDES: 186 mg/dL — AB (ref <150)
Total CHOL/HDL Ratio: 4.5 calc (ref <5.0)

## 2018-06-09 LAB — COMPLETE METABOLIC PANEL WITH GFR
AG RATIO: 1.7 (calc) (ref 1.0–2.5)
ALKALINE PHOSPHATASE (APISO): 88 U/L (ref 33–130)
ALT: 16 U/L (ref 6–29)
AST: 14 U/L (ref 10–35)
Albumin: 4.3 g/dL (ref 3.6–5.1)
BILIRUBIN TOTAL: 0.5 mg/dL (ref 0.2–1.2)
BUN: 11 mg/dL (ref 7–25)
CHLORIDE: 104 mmol/L (ref 98–110)
CO2: 26 mmol/L (ref 20–32)
Calcium: 9.6 mg/dL (ref 8.6–10.4)
Creat: 0.76 mg/dL (ref 0.50–1.05)
GFR, EST AFRICAN AMERICAN: 105 mL/min/{1.73_m2} (ref >=60)
GFR, Est Non African American: 90 mL/min/{1.73_m2} (ref >=60)
GLUCOSE: 75 mg/dL (ref 65–99)
Globulin: 2.5 g/dL (calc) (ref 1.9–3.7)
POTASSIUM: 4.1 mmol/L (ref 3.5–5.3)
Sodium: 140 mmol/L (ref 135–146)
Total Protein: 6.8 g/dL (ref 6.1–8.1)

## 2018-06-09 LAB — TSH: TSH: 1.43 m[IU]/L

## 2018-06-09 LAB — MAGNESIUM: Magnesium: 1.9 mg/dL (ref 1.5–2.5)

## 2018-06-09 LAB — VITAMIN D 25 HYDROXY (VIT D DEFICIENCY, FRACTURES): VIT D 25 HYDROXY: 30 ng/mL (ref 30–100)

## 2018-06-21 ENCOUNTER — Encounter: Payer: Self-pay | Admitting: Physician Assistant

## 2018-07-03 DIAGNOSIS — R399 Unspecified symptoms and signs involving the genitourinary system: Secondary | ICD-10-CM | POA: Diagnosis not present

## 2018-07-21 ENCOUNTER — Other Ambulatory Visit: Payer: Self-pay | Admitting: Physician Assistant

## 2018-07-21 ENCOUNTER — Ambulatory Visit
Admission: RE | Admit: 2018-07-21 | Discharge: 2018-07-21 | Disposition: A | Discharge status: Home / Self Care | Payer: BLUE CROSS/BLUE SHIELD | Source: Ambulatory Visit | Attending: Physician Assistant | Admitting: Physician Assistant

## 2018-07-21 DIAGNOSIS — R928 Other abnormal and inconclusive findings on diagnostic imaging of breast: Secondary | ICD-10-CM

## 2018-07-21 DIAGNOSIS — Z1231 Encounter for screening mammogram for malignant neoplasm of breast: Secondary | ICD-10-CM

## 2018-08-01 ENCOUNTER — Ambulatory Visit: Payer: Self-pay

## 2018-08-01 ENCOUNTER — Ambulatory Visit
Admission: RE | Admit: 2018-08-01 | Discharge: 2018-08-01 | Disposition: A | Discharge status: Home / Self Care | Payer: BLUE CROSS/BLUE SHIELD | Source: Ambulatory Visit | Attending: Physician Assistant | Admitting: Physician Assistant

## 2018-08-01 DIAGNOSIS — R922 Inconclusive mammogram: Secondary | ICD-10-CM | POA: Diagnosis not present

## 2018-08-01 DIAGNOSIS — R928 Other abnormal and inconclusive findings on diagnostic imaging of breast: Secondary | ICD-10-CM

## 2018-08-10 DIAGNOSIS — F4320 Adjustment disorder, unspecified: Secondary | ICD-10-CM | POA: Diagnosis not present

## 2018-08-12 DIAGNOSIS — N3000 Acute cystitis without hematuria: Secondary | ICD-10-CM | POA: Diagnosis not present

## 2018-08-25 DIAGNOSIS — F4322 Adjustment disorder with anxiety: Secondary | ICD-10-CM | POA: Diagnosis not present

## 2018-09-08 DIAGNOSIS — F4320 Adjustment disorder, unspecified: Secondary | ICD-10-CM | POA: Diagnosis not present

## 2018-09-28 DIAGNOSIS — F4322 Adjustment disorder with anxiety: Secondary | ICD-10-CM | POA: Diagnosis not present

## 2018-09-29 DIAGNOSIS — F4322 Adjustment disorder with anxiety: Secondary | ICD-10-CM | POA: Diagnosis not present

## 2018-10-11 DIAGNOSIS — F4322 Adjustment disorder with anxiety: Secondary | ICD-10-CM | POA: Diagnosis not present

## 2018-10-24 ENCOUNTER — Other Ambulatory Visit: Payer: Self-pay | Admitting: Internal Medicine

## 2018-10-24 MED ORDER — PREDNISONE 20 MG PO TABS: ORAL_TABLET | ORAL | 0 refills | Status: DC

## 2018-10-27 DIAGNOSIS — F4320 Adjustment disorder, unspecified: Secondary | ICD-10-CM | POA: Diagnosis not present

## 2018-10-30 ENCOUNTER — Other Ambulatory Visit: Payer: Self-pay | Admitting: Internal Medicine

## 2018-10-30 ENCOUNTER — Telehealth: Payer: Self-pay | Admitting: *Deleted

## 2018-10-30 MED ORDER — PREDNISONE 20 MG PO TABS: ORAL_TABLET | ORAL | 0 refills | Status: DC

## 2018-10-30 MED ORDER — AZITHROMYCIN 250 MG PO TABS: ORAL_TABLET | ORAL | 1 refills | Status: DC

## 2018-10-30 NOTE — Telephone Encounter (Signed)
Patient's partner called and reported the patient has a fever of 102.2 this AM. She is taking Prednisone.  Patient complained of fever, chills, headache, body ache and dry cough.  Dr Melford Aase sent RX's for a Z-pak and Prednisone to her pharmacy. Partner was advised to take her to the ED if she starts having shortness of breath. The patient didi travel to [place] within the last 14 days.

## 2018-10-31 DIAGNOSIS — F4322 Adjustment disorder with anxiety: Secondary | ICD-10-CM | POA: Diagnosis not present

## 2018-11-04 ENCOUNTER — Emergency Department (HOSPITAL_COMMUNITY)
Admission: EM | Admit: 2018-11-04 | Discharge: 2018-11-04 | Disposition: A | Discharge status: Home / Self Care | Payer: BLUE CROSS/BLUE SHIELD | Attending: Emergency Medicine | Admitting: Emergency Medicine

## 2018-11-04 ENCOUNTER — Encounter (HOSPITAL_COMMUNITY): Payer: Self-pay

## 2018-11-04 ENCOUNTER — Other Ambulatory Visit: Payer: Self-pay

## 2018-11-04 ENCOUNTER — Emergency Department (HOSPITAL_COMMUNITY): Payer: BLUE CROSS/BLUE SHIELD

## 2018-11-04 DIAGNOSIS — Z87891 Personal history of nicotine dependence: Secondary | ICD-10-CM | POA: Insufficient documentation

## 2018-11-04 DIAGNOSIS — J069 Acute upper respiratory infection, unspecified: Secondary | ICD-10-CM | POA: Diagnosis not present

## 2018-11-04 DIAGNOSIS — R509 Fever, unspecified: Secondary | ICD-10-CM | POA: Diagnosis not present

## 2018-11-04 DIAGNOSIS — R05 Cough: Secondary | ICD-10-CM | POA: Diagnosis not present

## 2018-11-04 MED ORDER — ACETAMINOPHEN 500 MG PO TABS
1000.0000 mg | ORAL_TABLET | Freq: Once | ORAL | Status: AC
  Administered 2018-11-04: 1000 mg via ORAL
  Filled 2018-11-04: qty 2

## 2018-11-04 MED ORDER — DOXYCYCLINE HYCLATE 100 MG PO CAPS: 100.0000 mg | ORAL_CAPSULE | Freq: Two times a day (BID) | ORAL | 0 refills | Status: AC

## 2018-11-04 NOTE — ED Triage Notes (Signed)
Onset 10-23-18 fever, chills, h/a, dry cough.  Dr. Melford Aase called in [place] and Prednisone.  The patient travelled to [place] within the last 14 days prior to 10-23-18.  Pt here today for fever, weakness, and decreased appetite.  Pt called Dr. Melford Aase who advised to come to ED as he is not prescribing any meds at this time.  No cough at this time.  Took Tylenol last night @ 11pm.

## 2018-11-04 NOTE — Discharge Instructions (Signed)
Call your physician for further clearance after 72 hours of fever,   If you live with, or provide care at home for, a person confirmed to have, or being evaluated for, COVID-19 infection please follow these guidelines to prevent infection:  Follow healthcare providers instructions Make sure that you understand and can help the patient follow any healthcare provider instructions for all care.  Provide for the patients basic needs You should help the patient with basic needs in the home and provide support for getting groceries, prescriptions, and other personal needs.  Monitor the patients symptoms If they are getting sicker, call his or her medical provider a  This will help the healthcare providers office take steps to keep other people from getting infected. Ask the healthcare provider to call the local or state health department.  Limit the number of people who have contact with the patient If possible, have only one caregiver for the patient. Other household members should stay in another home or place of residence. If this is not possible, they should stay in another room, or be separated from the patient as much as possible. Use a separate bathroom, if available. Restrict visitors who do not have an essential need to be in the home.  Keep older adults, very young children, and other sick people away from the patient Keep older adults, very young children, and those who have compromised immune systems or chronic health conditions away from the patient. This includes people with chronic heart, lung, or kidney conditions, diabetes, and cancer.  Ensure good ventilation Make sure that shared spaces in the home have good air flow, such as from an air conditioner or an opened window, weather permitting.  Wash your hands often Wash your hands often and thoroughly with soap and water for at least 20 seconds. You can use an alcohol based hand sanitizer if soap and water are not  available and if your hands are not visibly dirty. Avoid touching your eyes, nose, and mouth with unwashed hands. Use disposable paper towels to dry your hands. If not available, use dedicated cloth towels and replace them when they become wet.  Wear a facemask and gloves Wear a disposable facemask at all times in the room and gloves when you touch or have contact with the patients blood, body fluids, and/or secretions or excretions, such as sweat, saliva, sputum, nasal mucus, vomit, urine, or feces.  Ensure the mask fits over your nose and mouth tightly, and do not touch it during use. Throw out disposable facemasks and gloves after using them. Do not reuse. Wash your hands immediately after removing your facemask and gloves. If your personal clothing becomes contaminated, carefully remove clothing and launder. Wash your hands after handling contaminated clothing. Place all used disposable facemasks, gloves, and other waste in a lined container before disposing them with other household waste. Remove gloves and wash your hands immediately after handling these items.  Do not share dishes, glasses, or other household items with the patient Avoid sharing household items. You should not share dishes, drinking glasses, cups, eating utensils, towels, bedding, or other items After the person uses these items, you should wash them thoroughly with soap and water.  Wash laundry thoroughly Immediately remove and wash clothes or bedding that have blood, body fluids, and/or secretions or excretions, such as sweat, saliva, sputum, nasal mucus, vomit, urine, or feces, on them. Wear gloves when handling laundry from the patient. Read and follow directions on labels of laundry or clothing items and  detergent. In general, wash and dry with the warmest temperatures recommended on the label.  Clean all areas the individual has used often Clean all touchable surfaces, such as counters, tabletops, doorknobs,  bathroom fixtures, toilets, phones, keyboards, tablets, and bedside tables, every day. Also, clean any surfaces that may have blood, body fluids, and/or secretions or excretions on them. Wear gloves when cleaning surfaces the patient has come in contact with. Use a diluted bleach solution (e.g., dilute bleach with 1 part bleach and 10 parts water) or a household disinfectant with a label that says EPA-registered for coronaviruses. To make a bleach solution at home, add 1 tablespoon of bleach to 1 quart (4 cups) of water. For a larger supply, add  cup of bleach to 1 gallon (16 cups) of water. Read labels of cleaning products and follow recommendations provided on product labels. Labels contain instructions for safe and effective use of the cleaning product including precautions you should take when applying the product, such as wearing gloves or eye protection and making sure you have good ventilation during use of the product. Remove gloves and wash hands immediately after cleaning.  Monitor yourself for signs and symptoms of illness Caregivers and household members are considered close contacts, should monitor their health, and will be asked to limit movement outside of the home to the extent possible. Follow the monitoring steps for close contacts listed on the symptom monitoring form.   ? If you have additional questions, contact your local health department or call the epidemiologist on call at [phone] (available 24/7). ? This guidance is subject to change. For the most up-to-date guidance from [facility], please refer to their website: [email or web address]

## 2018-11-04 NOTE — ED Provider Notes (Signed)
[facility] EMERGENCY DEPARTMENT Provider Note   CSN: [id number] Arrival date & time: 11/04/18  1209    History   Chief Complaint Chief Complaint  Patient presents with  . Fever    HPI Ashley Carpenter is a 53 y.o. female.     The history is provided by the patient.  Fever  Max temp prior to arrival:  101 Temp source:  Oral Severity:  Mild Onset quality:  Gradual Duration:  9 days Timing:  Intermittent Progression:  Waxing and waning Chronicity:  New Relieved by:  Acetaminophen Worsened by:  Nothing Associated symptoms: cough and myalgias   Associated symptoms: no chest pain, no chills, no confusion, no dysuria, no ear pain, no rash, no rhinorrhea, no somnolence, no sore throat and no vomiting   Risk factors: recent sickness and recent travel     Past Medical History:  Diagnosis Date  . Allergy     Patient Active Problem List   Diagnosis Date Noted  . Cervical radiculitis 07/09/2017  . Occipital neuralgia of right side 07/09/2017  . Mixed hyperlipidemia 05/13/2017  . Vitamin D deficiency 03/10/2015  . Medication management 03/10/2015    Past Surgical History:  Procedure Laterality Date  . CESAREAN SECTION  2002   x 1     OB History   No obstetric history on file.      Home Medications    Prior to Admission medications   Medication Sig Start Date End Date Taking? Authorizing Provider  azithromycin (ZITHROMAX) 250 MG tablet Take 2 tablets (500 mg) on  Day 1,  followed by 1 tablet (250 mg) once daily on Days 2 through 5. 10/30/18   Unk Pinto, MD  doxycycline (VIBRAMYCIN) 100 MG capsule Take 1 capsule (100 mg total) by mouth 2 (two) times daily for 10 days. 11/04/18 11/14/18  Kengo Sturges, DO  predniSONE (DELTASONE) 20 MG tablet 1 tab 3 x day for 2 days, then 1 tab 2 x day for 2 days, then 1 tab 1 x day for 3 days 10/30/18   Unk Pinto, MD    Family History Family History  Problem Relation Age of Onset  . Hyperlipidemia  Mother   . Cancer Mother        small bowel CA- age 85 diagnosed  . Diabetes Father   . Heart disease Father   . Hypertension Father   . Colon polyps Father        non cancerous  . Cancer Maternal Grandmother   . Heart disease Maternal Grandfather   . Hypertension Maternal Grandfather   . Diabetes Paternal Grandfather   . Esophageal cancer Neg Hx   . Rectal cancer Neg Hx   . Stomach cancer Neg Hx     Social History Social History   Tobacco Use  . Smoking status: Former Smoker    Packs/day: 0.25    Types: Cigarettes    Last attempt to quit: 08/10/1983    Years since quitting: 35.2  . Smokeless tobacco: Never Used  Substance Use Topics  . Alcohol use: Yes    Alcohol/week: 1.0 standard drinks    Types: 1 Glasses of wine per week    Comment: occasional  . Drug use: No     Allergies   Patient has no known allergies.   Review of Systems Review of Systems  Constitutional: Positive for fever. Negative for chills.  HENT: Negative for ear pain, rhinorrhea and sore throat.   Eyes: Negative for pain and visual disturbance.  Respiratory: Positive for cough. Negative for shortness of breath.   Cardiovascular: Negative for chest pain and palpitations.  Gastrointestinal: Negative for abdominal pain and vomiting.  Genitourinary: Negative for dysuria and hematuria.  Musculoskeletal: Positive for myalgias. Negative for arthralgias and back pain.  Skin: Negative for color change and rash.  Neurological: Negative for seizures and syncope.  Psychiatric/Behavioral: Negative for confusion.  All other systems reviewed and are negative.    Physical Exam Updated Vital Signs  ED Triage Vitals  Enc Vitals Group     BP 11/04/18 1227 110/60     Pulse Rate 11/04/18 1212 72     Resp --      Temp 11/04/18 1212 (!) 100.9 F (38.3 C)     Temp Source 11/04/18 1212 Oral     SpO2 11/04/18 1212 91 %     Weight --      Height --      Head Circumference --      Peak Flow --      Pain  Score 11/04/18 1227 0     Pain Loc --      Pain Edu? --      Excl. in [place]? --     Physical Exam Vitals signs and nursing note reviewed.  Constitutional:      General: She is not in acute distress.    Appearance: She is well-developed.  HENT:     Head: Normocephalic and atraumatic.     Nose: Nose normal.     Mouth/Throat:     Mouth: Mucous membranes are moist.  Eyes:     Extraocular Movements: Extraocular movements intact.     Conjunctiva/sclera: Conjunctivae normal.     Pupils: Pupils are equal, round, and reactive to light.  Neck:     Musculoskeletal: Normal range of motion and neck supple.  Cardiovascular:     Rate and Rhythm: Normal rate and regular rhythm.     Pulses: Normal pulses.     Heart sounds: Normal heart sounds. No murmur.  Pulmonary:     Effort: Pulmonary effort is normal. No respiratory distress.     Breath sounds: Normal breath sounds. No stridor. No wheezing or rhonchi.  Abdominal:     General: Abdomen is flat.     Palpations: Abdomen is soft.     Tenderness: There is no abdominal tenderness.  Skin:    General: Skin is warm and dry.     Capillary Refill: Capillary refill takes less than 2 seconds.  Neurological:     General: No focal deficit present.     Mental Status: She is alert.  Psychiatric:        Mood and Affect: Mood normal.      ED Treatments / Results  Labs (all labs ordered are listed, but only abnormal results are displayed) Labs Reviewed - No data to display  EKG None  Radiology Dg Chest Portable 1 View  Result Date: 11/04/2018 CLINICAL DATA:  Cough, fever. EXAM: PORTABLE CHEST 1 VIEW COMPARISON:  None. FINDINGS: The heart size and mediastinal contours are within normal limits. No pneumothorax or pleural effusion is noted. Mild ill-defined bilateral basilar densities are noted concerning for possible atelectasis or pneumonia. The visualized skeletal structures are unremarkable. IMPRESSION: Mild bibasilar subsegmental atelectasis or  pneumonia is noted. Electronically Signed   By: Marijo Conception, M.D.   On: 11/04/2018 12:54    Procedures Procedures (including critical care time)  Medications Ordered in ED Medications  acetaminophen (TYLENOL) tablet 1,000  mg (1,000 mg Oral Given 11/04/18 1231)     Initial Impression / Assessment and Plan / ED Course  I have reviewed the triage vital signs and the nursing notes.  Pertinent labs & imaging results that were available during my care of the patient were reviewed by me and considered in my medical decision making (see chart for details).     Ashley Carpenter is a 53 year old female with no significant medical history presents the ED with fever, cough.  Patient with fever upon arrival.  Otherwise unremarkable vitals.  Normal work of breathing.  Clear breath sounds.  Patient has finished 2 courses of a Zithromax.  Has had fever intermittently for the last 9 to 10 days.  Fever started around [place]. [place] Day after traveling from [place]. Suspect patient likely has coronavirus.  Chest x-ray overall unremarkable, possible mild bibasilar pneumonia versus atelectasis.  She states that her cough has improved but fever still persists.  Does not have any shortness of breath or chest pain.  Overall given further instructions about fever care and coronavirus.  She has been self quarantine during this period as well.  I were proper PPE throughout the care of this patient.  She was given very strict return precautions and information about coronavirus.  This chart was dictated using voice recognition software.  Despite best efforts to proofread,  errors can occur which can change the documentation meaning.    Final Clinical Impressions(s) / ED Diagnoses   Final diagnoses:  Upper respiratory tract infection, unspecified type    ED Discharge Orders         Ordered    doxycycline (VIBRAMYCIN) 100 MG capsule  2 times daily     11/04/18 [place], Quita Skye, DO 11/04/18  1310

## 2018-11-04 NOTE — ED Notes (Signed)
Caryl Ada, patient's significant other wanted to leave his contact info [phone]

## 2018-11-05 ENCOUNTER — Telehealth: Payer: Self-pay | Admitting: Physician Assistant

## 2018-11-05 NOTE — Telephone Encounter (Signed)
Called patient to check in, she is feeling better after drinking a lot of gatorade and water. She did not cough during the phone call and was able to complete full sentences without an issues.   With her travel from [place], likely [place], she has been isolating at home since being sick. I reemphasized the guidelines for removing self quarantine. She has not had a fever today BUT is taking tylenol, reminded patient she can not be on medications and needs to be fever free to 3 days. Her symptoms are improving and it has been over 7 days since symptoms started.   Will follow up if any issues.

## 2018-11-07 ENCOUNTER — Telehealth: Payer: BLUE CROSS/BLUE SHIELD | Admitting: Physician Assistant

## 2018-11-07 ENCOUNTER — Other Ambulatory Visit: Payer: Self-pay

## 2018-11-07 DIAGNOSIS — R05 Cough: Secondary | ICD-10-CM

## 2018-11-07 DIAGNOSIS — F4322 Adjustment disorder with anxiety: Secondary | ICD-10-CM | POA: Diagnosis not present

## 2018-11-07 DIAGNOSIS — R059 Cough, unspecified: Secondary | ICD-10-CM

## 2018-11-07 MED ORDER — PROMETHAZINE-CODEINE 6.25-10 MG/5ML PO SYRP: 5.0000 mL | ORAL_SOLUTION | Freq: Four times a day (QID) | ORAL | 0 refills | Status: DC | PRN

## 2018-11-07 MED ORDER — BENZONATATE 200 MG PO CAPS: 200.0000 mg | ORAL_CAPSULE | Freq: Three times a day (TID) | ORAL | 0 refills | Status: DC | PRN

## 2018-11-07 NOTE — Progress Notes (Signed)
THIS ENCOUNTER IS A VIRTUAL VISIT DUE TO COVID-19 - PATIENT WAS NOT SEEN IN THE OFFICE.  PATIENT HAS CONSENTED TO VIRTUAL VISIT / TELEMEDICINE VISIT   Virtual Visit via telephone Note  I connected with Ashley Carpenter on 11/07/18  by telephone.  I verified that I am speaking with the correct person using two identifiers.    I discussed the limitations of evaluation and management by telemedicine and the availability of in person appointments. The patient expressed understanding and agreed to proceed.  History of Present Illness: 53 y.o. WF presents with SOB.  She states with any movement she would have to cough and have shortness of breath. She was worse this morning but has been able to drink gatorade and ensure and feels better. She is on tyelnol and is not having a fever. She has some fatigue but states overall she is doing better. She is able to hold her breath for 25 seconds.   Medications  Current Outpatient Medications (Endocrine & Metabolic):  .  predniSONE (DELTASONE) 20 MG tablet, 1 tab 3 x day for 2 days, then 1 tab 2 x day for 2 days, then 1 tab 1 x day for 3 days      Current Outpatient Medications (Other):  .  azithromycin (ZITHROMAX) 250 MG tablet, Take 2 tablets (500 mg) on  Day 1,  followed by 1 tablet (250 mg) once daily on Days 2 through 5. .  doxycycline (VIBRAMYCIN) 100 MG capsule, Take 1 capsule (100 mg total) by mouth 2 (two) times daily for 10 days.  Problem list She has Vitamin D deficiency; Medication management; Mixed hyperlipidemia; Cervical radiculitis; and Occipital neuralgia of right side on their problem list.   Observations/Objective: General Appearance:, in no apparent distress.  ENT/Mouth: No hoarseness, dry barking cough sporadically throughout the visit Respiratory: completing full sentences without distress on the phone , able to hold breath x 25 seconds without an issue Neuro: Awake and oriented X 3,  Psych:  Insight and Judgment appropriate.    Assessment and Plan: Diagnoses and all orders for this visit:  Cough At this time we will continue to monitor her closely, we will send in cough medications.  The patient was advised to call immediately if she has any concerning symptoms in the interval. The patient voices understanding of current treatment options and is in agreement with the current care plan.The patient knows to call the clinic with any problems, questions or concerns or go to the ER if any further progression of symptoms.  Her boyfriend will self quarantine at this time -     Discontinue: promethazine-codeine (PHENERGAN WITH CODEINE) 6.25-10 MG/5ML syrup; Take 5 mLs by mouth every 6 (six) hours as needed for cough. Max: 70mL per day -     benzonatate (TESSALON) 200 MG capsule; Take 1 capsule (200 mg total) by mouth 3 (three) times daily as needed for cough (Max: 600mg  per day). -     promethazine-codeine (PHENERGAN WITH CODEINE) 6.25-10 MG/5ML syrup; Take 5 mLs by mouth every 6 (six) hours as needed for cough. Max: 68mL per day   Follow Up Instructions:    I discussed the assessment and treatment plan with the patient. The patient was provided an opportunity to ask questions and all were answered. The patient agreed with the plan and demonstrated an understanding of the instructions.   The patient was advised to call back or seek an in-person evaluation if the symptoms worsen or if the condition fails to improve  as anticipated.  I provided 20 minutes of non-face-to-face time during this encounter.   Vicie Mutters, PA-C

## 2018-11-30 DIAGNOSIS — F4322 Adjustment disorder with anxiety: Secondary | ICD-10-CM | POA: Diagnosis not present

## 2018-12-13 DIAGNOSIS — F4322 Adjustment disorder with anxiety: Secondary | ICD-10-CM | POA: Diagnosis not present

## 2018-12-22 DIAGNOSIS — F4322 Adjustment disorder with anxiety: Secondary | ICD-10-CM | POA: Diagnosis not present

## 2019-01-18 DIAGNOSIS — F4320 Adjustment disorder, unspecified: Secondary | ICD-10-CM | POA: Diagnosis not present

## 2019-04-04 ENCOUNTER — Ambulatory Visit: Payer: BLUE CROSS/BLUE SHIELD | Admitting: Physician Assistant

## 2019-04-04 ENCOUNTER — Encounter: Payer: Self-pay | Admitting: Physician Assistant

## 2019-04-04 ENCOUNTER — Other Ambulatory Visit: Payer: Self-pay

## 2019-04-04 VITALS — BP 102/70 | HR 55 | Temp 97.7°F | Ht 65.0 in | Wt 150.2 lb

## 2019-04-04 DIAGNOSIS — R590 Localized enlarged lymph nodes: Secondary | ICD-10-CM

## 2019-04-04 NOTE — Progress Notes (Signed)
   Subjective:    Patient ID: Ashley Carpenter, female    DOB: August 01, 1966, 53 y.o.   MRN: [id number]  HPI 53 y.o.WF presents with neck lump.  She had neck soreness yesterday and felt a lump on right side. No fever, chills.   Blood pressure 102/70, pulse (!) 55, temperature 97.7 F (36.5 C), height 5\' 5"  (1.651 m), weight 150 lb 3.2 oz (68.1 kg), SpO2 98 %.  Review of Systems  Constitutional: Negative.  Negative for chills, fatigue and fever.  HENT: Negative.  Negative for dental problem, ear pain, sinus pressure, sinus pain and sore throat.   Respiratory: Negative.  Negative for cough and shortness of breath.   Cardiovascular: Negative.   Gastrointestinal: Negative.   Genitourinary: Negative.   Musculoskeletal: Negative.   Skin: Negative.   Neurological: Negative.   Hematological: Positive for adenopathy.  Psychiatric/Behavioral: Negative.        Objective:   Physical Exam Cardiovascular:     Rate and Rhythm: Normal rate and regular rhythm.  No extrasystoles are present.    Pulses: Normal pulses.  Pulmonary:     Effort: Pulmonary effort is normal.     Breath sounds: Normal breath sounds. No decreased breath sounds.  Abdominal:     Palpations: There is no splenomegaly.  Lymphadenopathy:     Head:     Right side of head: No submandibular or tonsillar adenopathy.     Left side of head: No submandibular or tonsillar adenopathy.     Cervical: Cervical adenopathy present.     Right cervical: Superficial cervical adenopathy present. No deep or posterior cervical adenopathy.    Left cervical: No superficial, deep or posterior cervical adenopathy.     Upper Body:     Right upper body: No supraclavicular, axillary, pectoral or epitrochlear adenopathy.     Left upper body: No supraclavicular, axillary, pectoral or epitrochlear adenopathy.        Assessment & Plan:   Anterior cervical adenopathy -     US Soft Tissue Head/Neck; Future More superficial, non tender, no other  adenopathy, no splenomegaly More like seb cyst however will get [place] since non tender  Future Appointments  Date Time Provider [place]  06/07/2019  3:00 PM Vicie Mutters, PA-C [id number] None

## 2019-04-04 NOTE — Patient Instructions (Signed)
YOU CAN CALL TO MAKE AN ULTRASOUND..  I have put in an order for an ultrasound for you to have You can set them up at your convenience by calling this number [phone] You will likely have the ultrasound at [facility]  If you have any issues call our office and we will set this up for you.    Epidermal Cyst  An epidermal cyst is a sac made of skin tissue. The sac contains a substance called keratin. Keratin is a protein that is normally secreted through the hair follicles. When keratin becomes trapped in the top layer of skin (epidermis), it can form an epidermal cyst. Epidermal cysts can be found anywhere on your body. These cysts are usually harmless (benign), and they may not cause symptoms unless they become infected. What are the causes? This condition may be caused by:  A blocked hair follicle.  A hair that curls and re-enters the skin instead of growing straight out of the skin (ingrown hair).  A blocked pore.  Irritated skin.  An injury to the skin.  Certain conditions that are passed along from parent to child (inherited).  Human papillomavirus (HPV).  Long-term (chronic) sun damage to the skin. What increases the risk? The following factors may make you more likely to develop an epidermal cyst:  Having acne.  Being overweight.  Being 59-62 years old. What are the signs or symptoms? The only symptom of this condition may be a small, painless lump underneath the skin. When an epidermal cyst ruptures, it may become infected. Symptoms may include:  Redness.  Inflammation.  Tenderness.  Warmth.  Fever.  Keratin draining from the cyst. Keratin is grayish-white, bad-smelling substance.  Pus draining from the cyst. How is this diagnosed? This condition is diagnosed with a physical exam.  In some cases, you may have a sample of tissue (biopsy) taken from your cyst to be examined under a microscope or tested for bacteria.  You may be  referred to a health care provider who specializes in skin care (dermatologist). How is this treated? In many cases, epidermal cysts go away on their own without treatment. If a cyst becomes infected, treatment may include:  Opening and draining the cyst, done by a health care provider. After draining, minor surgery to remove the rest of the cyst may be done.  Antibiotic medicine.  Injections of medicines (steroids) that help to reduce inflammation.  Surgery to remove the cyst. Surgery may be done if the cyst: ? Becomes large. ? Bothers you. ? Has a chance of turning into cancer.  Do not try to open a cyst yourself. Follow these instructions at home:  Take over-the-counter and prescription medicines only as told by your health care provider.  If you were prescribed an antibiotic medicine, take it it as told by your health care provider. Do not stop using the antibiotic even if you start to feel better.  Keep the area around your cyst clean and dry.  Wear loose, dry clothing.  Avoid touching your cyst.  Check your cyst every day for signs of infection. Check for: ? Redness, swelling, or pain. ? Fluid or blood. ? Warmth. ? Pus or a bad smell.  Keep all follow-up visits as told by your health care provider. This is important. How is this prevented?  Wear clean, dry, clothing.  Avoid wearing tight clothing.  Keep your skin clean and dry. Take showers or baths every day. Contact a health  care provider if:  Your cyst develops symptoms of infection.  Your condition is not improving or is getting worse.  You develop a cyst that looks different from other cysts you have had.  You have a fever. Get help right away if:  Redness spreads from the cyst into the surrounding area. Summary  An epidermal cyst is a sac made of skin tissue. These cysts are usually harmless (benign), and they may not cause symptoms unless they become infected.  If a cyst becomes infected,  treatment may include surgery to open and drain the cyst, or to remove it. Treatment may also include medicines by mouth or through an injection.  Take over-the-counter and prescription medicines only as told by your health care provider. If you were prescribed an antibiotic medicine, take it as told by your health care provider. Do not stop using the antibiotic even if you start to feel better.  Contact a health care provider if your condition is not improving or is getting worse.  Keep all follow-up visits as told by your health care provider. This is important. This information is not intended to replace advice given to you by your health care provider. Make sure you discuss any questions you have with your health care provider. Document Released: 06/26/2004 Document Revised: 11/16/2018 Document Reviewed: 02/06/2018 Elsevier Patient Education  2020 [employer].

## 2019-04-05 ENCOUNTER — Ambulatory Visit
Admission: RE | Admit: 2019-04-05 | Discharge: 2019-04-05 | Disposition: A | Discharge status: Home / Self Care | Payer: BLUE CROSS/BLUE SHIELD | Source: Ambulatory Visit | Attending: Physician Assistant | Admitting: Physician Assistant

## 2019-04-05 DIAGNOSIS — R590 Localized enlarged lymph nodes: Secondary | ICD-10-CM

## 2019-04-05 DIAGNOSIS — R221 Localized swelling, mass and lump, neck: Secondary | ICD-10-CM | POA: Diagnosis not present

## 2019-04-05 DIAGNOSIS — E041 Nontoxic single thyroid nodule: Secondary | ICD-10-CM | POA: Diagnosis not present

## 2019-04-05 LAB — COMPLETE METABOLIC PANEL WITH GFR
AG Ratio: 1.7 (calc) (ref 1.0–2.5)
ALT: 12 U/L (ref 6–29)
AST: 15 U/L (ref 10–35)
Albumin: 4.3 g/dL (ref 3.6–5.1)
Alkaline phosphatase (APISO): 74 U/L (ref 37–153)
BUN: 15 mg/dL (ref 7–25)
CO2: 27 mmol/L (ref 20–32)
Calcium: 10.2 mg/dL (ref 8.6–10.4)
Chloride: 105 mmol/L (ref 98–110)
Creat: 0.75 mg/dL (ref 0.50–1.05)
GFR, Est African American: 105 mL/min/{1.73_m2} (ref >=60)
GFR, Est Non African American: 91 mL/min/{1.73_m2} (ref >=60)
Globulin: 2.6 g/dL (calc) (ref 1.9–3.7)
Glucose, Bld: 95 mg/dL (ref 65–99)
Potassium: 4.1 mmol/L (ref 3.5–5.3)
Sodium: 141 mmol/L (ref 135–146)
Total Bilirubin: 0.5 mg/dL (ref 0.2–1.2)
Total Protein: 6.9 g/dL (ref 6.1–8.1)

## 2019-04-05 LAB — CBC WITH DIFFERENTIAL/PLATELET
Absolute Monocytes: 358 cells/uL (ref 200–950)
Basophils Absolute: 49 cells/uL (ref 0–200)
Basophils Relative: 1 %
Eosinophils Absolute: 59 cells/uL (ref 15–500)
Eosinophils Relative: 1.2 %
HCT: 39.4 % (ref 35.0–45.0)
Hemoglobin: 13.3 g/dL (ref 11.7–15.5)
Lymphs Abs: 1460 cells/uL (ref 850–3900)
MCH: 30.9 pg (ref 27.0–33.0)
MCHC: 33.8 g/dL (ref 32.0–36.0)
MCV: 91.6 fL (ref 80.0–100.0)
MPV: 9.9 fL (ref 7.5–12.5)
Monocytes Relative: 7.3 %
Neutro Abs: 2974 cells/uL (ref 1500–7800)
Neutrophils Relative %: 60.7 %
Platelets: 316 10*3/uL (ref 140–400)
RBC: 4.3 10*6/uL (ref 3.80–5.10)
RDW: 12 % (ref 11.0–15.0)
Total Lymphocyte: 29.8 %
WBC: 4.9 10*3/uL (ref 3.8–10.8)

## 2019-05-16 ENCOUNTER — Telehealth: Payer: Self-pay | Admitting: Physician Assistant

## 2019-05-16 DIAGNOSIS — R590 Localized enlarged lymph nodes: Secondary | ICD-10-CM

## 2019-05-16 NOTE — Telephone Encounter (Signed)
-----   Message from Elenor Quinones, Parker sent at 05/15/2019  5:09 PM EDT ----- Regarding: referral needed Contact: [phone] Needs a referral to a specialist for her NECK per patient

## 2019-05-23 DIAGNOSIS — R590 Localized enlarged lymph nodes: Secondary | ICD-10-CM | POA: Diagnosis not present

## 2019-06-06 NOTE — Progress Notes (Signed)
Complete Physical  Assessment and Plan:  Encounter for general adult medical examination with abnormal findings -     CBC with Differential/Platelet -     COMPLETE METABOLIC PANEL WITH GFR -     TSH -     Lipid panel -     Hemoglobin A1c -     Magnesium -     VITAMIN D 25 Hydroxy (Vit-D Deficiency, Fractures) -     Urinalysis, Routine w reflex microscopic -     Microalbumin / creatinine urine ratio -     Iron,Total/Total Iron Binding Cap -     Vitamin B12 -     EKG 12-Lead -     MICROSCOPIC MESSAGE  Mixed hyperlipidemia check lipids decrease fatty foods increase activity.  -     Lipid panel  Medication management -     CBC with Differential/Platelet -     COMPLETE METABOLIC PANEL WITH GFR -     Magnesium  Cervical radiculitis Monitor  Occipital neuralgia of right side Monitor  Vitamin D deficiency -     VITAMIN D 25 Hydroxy (Vit-D Deficiency, Fractures)  Screening for thyroid disorder -     TSH  Screening for hematuria or proteinuria -     Urinalysis, Routine w reflex microscopic -     Microalbumin / creatinine urine ratio  Screening for deficiency anemia -     Iron,Total/Total Iron Binding Cap -     Vitamin B12  Needs flu shot -     FLU VACCINE MDCK QUAD W/Preservative  Screening for diabetes mellitus -     Hemoglobin A1c  Screening for cardiovascular condition -     EKG 12-Lead  Elevated lipoprotein A level HIGHLY SUGGEST GETTING ON STATIN WITH THIS INFORMATION AND FAMILY HISTORY  Anterior cervical adenopathy -     Epstein-Barr virus VCA antibody panel - still with adenopathy, more prominent than last time but still superficial and mobile, will follow up with ENT  Gastroesophageal reflux disease with esophagitis without hemorrhage -     famotidine (PEPCID) 40 MG tablet; Take 1 tablet (40 mg total) by mouth every evening. - intermittent globulus sensation in her throat and occ GERD, nontender RUQ, no epigastric tenderness, will give pepcid and  diet discussed if not better can do further evaluation   Discussed med's effects and SE's. Screening labs and tests as requested with regular follow-up as recommended. Over 40 minutes of exam, counseling, chart review, and complex, high level critical decision making was performed this visit.   HPI  53 y.o. female  presents for a complete physical and follow up for has Vitamin D deficiency; Medication management; Mixed hyperlipidemia; Cervical radiculitis; Occipital neuralgia of right side; and Elevated lipoprotein A level on their problem list..  She is following with Dr. Janace Carpenter for right cervical mass/adenopathy, no night sweats, no weight loss. No other adenopathy. She has been having a globulus sensation in her throat but intermittent and she does have GERD. No ETOH or NSAIDS. No black stool, blood in stool.   Her blood pressure has been controlled at home, today their BP is BP: 124/78 She does workout. She denies chest pain, shortness of breath, dizziness.   She is not on cholesterol medication. Her cholesterol is not at goal. She is trying to move more. History of dad with MI in 24's, PGM bypass in her 77's, both smokers. The cholesterol last visit was:   Lab Results  Component Value Date   CHOL 280 (H)  06/07/2019   HDL 64 06/07/2019   LDLCALC 193 (H) 06/07/2019   TRIG 107 06/07/2019   CHOLHDL 4.4 06/07/2019    Last A1C in the office was:  Lab Results  Component Value Date   HGBA1C 4.6 06/07/2019   Patient is on Vitamin D supplement.   Lab Results  Component Value Date   VD25OH 67 06/07/2019     BMI is Body mass index is 25.53 kg/m., she is working on diet and exercise. Wt Readings from Last 3 Encounters:  06/07/19 153 lb 6.4 oz (69.6 kg)  04/04/19 150 lb 3.2 oz (68.1 kg)  05/30/18 160 lb 9.6 oz (72.8 kg)   She has stress, teenage daughter, driving but not by herself, 71 tomorrow, Ashley Carpenter.   Current Medications:  No current outpatient medications on file prior to visit.    No current facility-administered medications on file prior to visit.    Allergies:  No Known Allergies  Medical History:  She has Vitamin D deficiency; Medication management; Mixed hyperlipidemia; Cervical radiculitis; Occipital neuralgia of right side; and Elevated lipoprotein A level on their problem list.  Health Maintenance:   Immunization History  Administered Date(s) Administered  . Influenza Inj Mdck Quad With Preservative 06/07/2019  . Influenza Split 04/15/2011  . 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   No LMP recorded. Patient is postmenopausal. Pap: 2015 neg HPV, neg pap due 2020 MGM:10/2015  DEXA: due age 57 Colonoscopy: 09/2015 10 years US neck 03/2019: IMPRESSION: 1 cm soft tissue lesion within the superficial soft tissues of the right neck in the region of clinical concern. Characteristics are nonspecific, with both malignant and benign etiology on the differential diagnosis.  Patient Care Team: Ashley Pinto, MD as PCP - General (Internal Medicine) System, Pcp Not In  Surgical History:  She has a past surgical history that includes Cesarean section (2002). Family History:  Herfamily history includes Cancer in her maternal grandmother and mother; Colon polyps in her father; Diabetes in her father and paternal grandfather; Heart disease in her father and maternal grandfather; Hyperlipidemia in her mother; Hypertension in her father and maternal grandfather. Social History:  She reports that she quit smoking about 35 years ago. Her smoking use included cigarettes. She smoked 0.25 packs per day. She has never used smokeless tobacco. She reports current alcohol use of about 1.0 standard drinks of alcohol per week. She reports that she does not use drugs.  Review of Systems: Review of Systems  Constitutional: Negative.   HENT: Negative.   Eyes:  Negative.   Respiratory: Negative.   Cardiovascular: Negative.   Gastrointestinal: Negative.   Genitourinary: Negative.   Musculoskeletal: Negative.   Skin: Negative.     Physical Exam: Estimated body mass index is 25.53 kg/m as calculated from the following:   Height as of this encounter: 5\' 5"  (1.651 m).   Weight as of this encounter: 153 lb 6.4 oz (69.6 kg). BP 124/78   Pulse 76   Temp (!) 97.3 F (36.3 C)   Ht 5\' 5"  (1.651 m)   Wt 153 lb 6.4 oz (69.6 kg)   SpO2 98%   BMI 25.53 kg/m  General Appearance: Well nourished, in no apparent distress.  Eyes: PERRLA, EOMs, conjunctiva no swelling or erythema, normal fundi and vessels.  Sinuses: No Frontal/maxillary tenderness  ENT/Mouth: Ext aud canals clear, normal light reflex with TMs without erythema, bulging. Good dentition. No  erythema, swelling, or exudate on post pharynx. Tonsils not swollen or erythematous. Hearing normal.  Neck: Supple, thyroid normal. No bruits  Respiratory: Respiratory effort normal, BS equal bilaterally without rales, rhonchi, wheezing or stridor.  Cardio: RRR without murmurs, rubs or gallops. Brisk peripheral pulses without edema.  Chest: symmetric, with normal excursions and percussion.  Breasts: Symmetric, without lumps, nipple discharge, retractions.  Abdomen: Soft, nontender, no guarding, rebound, hernias, masses, or organomegaly.  Lymphatics: Non tender without lymphadenopathy.  Genitourinary: defer 2020 Musculoskeletal: Full ROM all peripheral extremities,5/5 strength, and normal gait.  Skin: lipoma right anterior medial thigh. Warm, dry without rashes, lesions, ecchymosis. Neuro: Cranial nerves intact, reflexes equal bilaterally. Normal muscle tone, no cerebellar symptoms. Sensation intact.  Psych: Awake and oriented X 3, normal affect, Insight and Judgment appropriate.   EKG: WNL no ST changes. AORTA SCAN: defer  Ashley Carpenter 8:50 AM [facility]

## 2019-06-07 ENCOUNTER — Other Ambulatory Visit: Payer: Self-pay

## 2019-06-07 ENCOUNTER — Encounter: Payer: Self-pay | Admitting: Physician Assistant

## 2019-06-07 ENCOUNTER — Ambulatory Visit: Payer: BC Managed Care – PPO | Admitting: Physician Assistant

## 2019-06-07 VITALS — BP 124/78 | HR 76 | Temp 97.3°F | Ht 65.0 in | Wt 153.4 lb

## 2019-06-07 DIAGNOSIS — E7841 Elevated Lipoprotein(a): Secondary | ICD-10-CM

## 2019-06-07 DIAGNOSIS — Z1389 Encounter for screening for other disorder: Secondary | ICD-10-CM | POA: Diagnosis not present

## 2019-06-07 DIAGNOSIS — Z0000 Encounter for general adult medical examination without abnormal findings: Secondary | ICD-10-CM | POA: Diagnosis not present

## 2019-06-07 DIAGNOSIS — Z79899 Other long term (current) drug therapy: Secondary | ICD-10-CM | POA: Diagnosis not present

## 2019-06-07 DIAGNOSIS — Z136 Encounter for screening for cardiovascular disorders: Secondary | ICD-10-CM

## 2019-06-07 DIAGNOSIS — E559 Vitamin D deficiency, unspecified: Secondary | ICD-10-CM

## 2019-06-07 DIAGNOSIS — Z0001 Encounter for general adult medical examination with abnormal findings: Secondary | ICD-10-CM

## 2019-06-07 DIAGNOSIS — Z13220 Encounter for screening for lipoid disorders: Secondary | ICD-10-CM

## 2019-06-07 DIAGNOSIS — Z1329 Encounter for screening for other suspected endocrine disorder: Secondary | ICD-10-CM

## 2019-06-07 DIAGNOSIS — Z23 Encounter for immunization: Secondary | ICD-10-CM

## 2019-06-07 DIAGNOSIS — Z130 Encounter for screening for diseases of the blood and blood-forming organs and certain disorders involving the immune mechanism: Secondary | ICD-10-CM | POA: Diagnosis not present

## 2019-06-07 DIAGNOSIS — I10 Essential (primary) hypertension: Secondary | ICD-10-CM | POA: Diagnosis not present

## 2019-06-07 DIAGNOSIS — Z131 Encounter for screening for diabetes mellitus: Secondary | ICD-10-CM | POA: Diagnosis not present

## 2019-06-07 DIAGNOSIS — K2100 Gastro-esophageal reflux disease with esophagitis, without bleeding: Secondary | ICD-10-CM

## 2019-06-07 DIAGNOSIS — M5481 Occipital neuralgia: Secondary | ICD-10-CM

## 2019-06-07 DIAGNOSIS — M5412 Radiculopathy, cervical region: Secondary | ICD-10-CM

## 2019-06-07 DIAGNOSIS — E782 Mixed hyperlipidemia: Secondary | ICD-10-CM

## 2019-06-07 DIAGNOSIS — R590 Localized enlarged lymph nodes: Secondary | ICD-10-CM

## 2019-06-07 MED ORDER — FAMOTIDINE 40 MG PO TABS: 40.0000 mg | ORAL_TABLET | Freq: Every evening | ORAL | 1 refills | Status: DC

## 2019-06-07 NOTE — Patient Instructions (Addendum)
Silent reflux: Not all heartburn burns...Marland KitchenMarland KitchenMarland Kitchen  What is LPR? Laryngopharyngeal reflux (LPR) or silent reflux is a condition in which acid that is made in the stomach travels up the esophagus (swallowing tube) and gets to the throat. Not everyone with reflux has a lot of heartburn or indigestion. In fact, many people with LPR never have heartburn. This is why LPR is called SILENT REFLUX, and the terms "Silent reflux" and "LPR" are often used interchangeably. Because LPR is silent, it is sometimes difficult to diagnose.  How can you tell if you have LPR?  Marland Kitchen Chronic hoarseness- Some people have hoarseness that comes and goes . throat clearing  . Cough . It can cause shortness of breath and cause asthma like symptoms. Marland Kitchen a feeling of a lump in the throat  . difficulty swallowing . a problem with too much nose and throat drainage.  . Some people will feel their esophagus spasm which feels like their heart beating hard and fast, this will usually be after a meal, at rest, or lying down at night.    How do I treat this? Treatment for LPR should be individualized, and your doctor will suggest the best treatment for you. Generally there are several treatments for LPR: . changing habits and diet to reduce reflux,  . medications to reduce stomach acid, and  . surgery to prevent reflux. Most people with LPR need to modify how and when they eat, as well as take some medication, to get well. Sometimes, nonprescription liquid antacids, such as Maalox, Gelucil and Mylanta are recommended. When used, these antacids should be taken four times each day - one tablespoon one hour after each meal and before bedtime. Dietary and lifestyle changes alone are not often enough to control LPR - medications that reduce stomach acid are also usually needed. These must be prescribed by our doctor.   TIPS FOR REDUCING REFLUX AND LPR Control your LIFE-STYLE and your DIET! Marland Kitchen If you use tobacco, QUIT.  Marland Kitchen Smoking makes you  reflux. After every cigarette you have some LPR.  . Don't wear clothing that is too tight, especially around the waist (trousers, corsets, belts).  . Do not lie down just after eating...in fact, do not eat within three hours of bedtime.  . You should be on a low-fat diet.  . Limit your intake of red meat.  . Limit your intake of butter.  Marland Kitchen Avoid fried foods.  . Avoid chocolate  . Avoid cheese.  Marland Kitchen Avoid eggs. Marland Kitchen Specifically avoid caffeine (especially coffee and tea), soda pop (especially cola) and mints.  . Avoid alcoholic beverages, particularly in the evening.  Your LDL could improve, ideally we want it under a 100.  Your LDL is the bad cholesterol that can lead to heart attack and stroke. To lower your number you can decrease your fatty foods, red meat, cheese, milk and increase fiber like whole grains and veggies. You can also add a fiber supplement like Citracel or Benefiber, these do not cause gas and bloating and are safe to use. Especially if you have a strong family history of heart disease or stroke or you have evidence of plaque on any imaging like a chest xray, we may discuss at your next office visit putting you on a medication to get your number below 100.

## 2019-06-08 ENCOUNTER — Encounter: Payer: Self-pay | Admitting: Physician Assistant

## 2019-06-08 LAB — EPSTEIN-BARR VIRUS VCA ANTIBODY PANEL
EBV NA IgG: 166 U/mL — ABNORMAL HIGH
EBV VCA IgG: 177 U/mL — ABNORMAL HIGH
EBV VCA IgM: <36 U/mL

## 2019-06-08 LAB — CBC WITH DIFFERENTIAL/PLATELET
Absolute Monocytes: 461 cells/uL (ref 200–950)
Basophils Absolute: 49 cells/uL (ref 0–200)
Basophils Relative: 1 %
Eosinophils Absolute: 78 cells/uL (ref 15–500)
Eosinophils Relative: 1.6 %
HCT: 39.2 % (ref 35.0–45.0)
Hemoglobin: 13.4 g/dL (ref 11.7–15.5)
Lymphs Abs: 1945 cells/uL (ref 850–3900)
MCH: 31.5 pg (ref 27.0–33.0)
MCHC: 34.2 g/dL (ref 32.0–36.0)
MCV: 92 fL (ref 80.0–100.0)
MPV: 9.7 fL (ref 7.5–12.5)
Monocytes Relative: 9.4 %
Neutro Abs: 2367 cells/uL (ref 1500–7800)
Neutrophils Relative %: 48.3 %
Platelets: 322 10*3/uL (ref 140–400)
RBC: 4.26 10*6/uL (ref 3.80–5.10)
RDW: 12.2 % (ref 11.0–15.0)
Total Lymphocyte: 39.7 %
WBC: 4.9 10*3/uL (ref 3.8–10.8)

## 2019-06-08 LAB — COMPLETE METABOLIC PANEL WITH GFR
AG Ratio: 1.6 (calc) (ref 1.0–2.5)
ALT: 16 U/L (ref 6–29)
AST: 17 U/L (ref 10–35)
Albumin: 4.3 g/dL (ref 3.6–5.1)
Alkaline phosphatase (APISO): 75 U/L (ref 37–153)
BUN: 15 mg/dL (ref 7–25)
CO2: 28 mmol/L (ref 20–32)
Calcium: 10.3 mg/dL (ref 8.6–10.4)
Chloride: 102 mmol/L (ref 98–110)
Creat: 0.68 mg/dL (ref 0.50–1.05)
GFR, Est African American: 116 mL/min/{1.73_m2} (ref >=60)
GFR, Est Non African American: 100 mL/min/{1.73_m2} (ref >=60)
Globulin: 2.7 g/dL (calc) (ref 1.9–3.7)
Glucose, Bld: 88 mg/dL (ref 65–99)
Potassium: 4.1 mmol/L (ref 3.5–5.3)
Sodium: 140 mmol/L (ref 135–146)
Total Bilirubin: 0.4 mg/dL (ref 0.2–1.2)
Total Protein: 7 g/dL (ref 6.1–8.1)

## 2019-06-08 LAB — URINALYSIS, ROUTINE W REFLEX MICROSCOPIC
Bacteria, UA: NONE SEEN /HPF
Bilirubin Urine: NEGATIVE
Glucose, UA: NEGATIVE
Hgb urine dipstick: NEGATIVE
Hyaline Cast: NONE SEEN /LPF
Ketones, ur: NEGATIVE
Nitrite: NEGATIVE
Protein, ur: NEGATIVE
RBC / HPF: NONE SEEN /HPF (ref 0–2)
Specific Gravity, Urine: 1.007 (ref 1.001–1.03)
Squamous Epithelial / HPF: NONE SEEN /HPF (ref <=5)
pH: 6 (ref 5.0–8.0)

## 2019-06-08 LAB — IRON, TOTAL/TOTAL IRON BINDING CAP
Iron: 74 ug/dL (ref 45–160)
TIBC: 341 mcg/dL (calc) (ref 250–450)

## 2019-06-08 LAB — MICROALBUMIN / CREATININE URINE RATIO
Creatinine, Urine: 21 mg/dL (ref 20–275)
Microalb Creat Ratio: 10 mcg/mg creat (ref <30)
Microalb, Ur: 0.2 mg/dL

## 2019-06-08 LAB — HEMOGLOBIN A1C
Hgb A1c MFr Bld: 4.6 % of total Hgb (ref <5.7)
Mean Plasma Glucose: 85 (calc)
eAG (mmol/L): 4.7 (calc)

## 2019-06-08 LAB — LIPID PANEL
Cholesterol: 280 mg/dL — ABNORMAL HIGH (ref <200)
HDL: 64 mg/dL (ref >=50)
LDL Cholesterol (Calc): 193 mg/dL (calc) — ABNORMAL HIGH
Non-HDL Cholesterol (Calc): 216 mg/dL (calc) — ABNORMAL HIGH (ref <130)
Total CHOL/HDL Ratio: 4.4 (calc) (ref <5.0)
Triglycerides: 107 mg/dL (ref <150)

## 2019-06-08 LAB — MAGNESIUM: Magnesium: 2.1 mg/dL (ref 1.5–2.5)

## 2019-06-08 LAB — TSH: TSH: 1.9 mIU/L

## 2019-06-08 LAB — VITAMIN B12: Vitamin B-12: 434 pg/mL (ref 200–1100)

## 2019-06-08 LAB — IRON,?TOTAL/TOTAL IRON BINDING CAP: %SAT: 22 % (calc) (ref 16–45)

## 2019-06-08 LAB — VITAMIN D 25 HYDROXY (VIT D DEFICIENCY, FRACTURES): Vit D, 25-Hydroxy: 67 ng/mL (ref 30–100)

## 2019-06-08 MED ORDER — ROSUVASTATIN CALCIUM 5 MG PO TABS: 5.0000 mg | ORAL_TABLET | Freq: Every day | ORAL | 1 refills | Status: DC

## 2019-06-12 ENCOUNTER — Encounter: Payer: Self-pay | Admitting: Adult Health Nurse Practitioner

## 2019-06-19 ENCOUNTER — Other Ambulatory Visit: Payer: Self-pay | Admitting: Internal Medicine

## 2019-06-19 DIAGNOSIS — Z1231 Encounter for screening mammogram for malignant neoplasm of breast: Secondary | ICD-10-CM

## 2019-06-21 DIAGNOSIS — R590 Localized enlarged lymph nodes: Secondary | ICD-10-CM | POA: Diagnosis not present

## 2019-08-08 ENCOUNTER — Other Ambulatory Visit: Payer: Self-pay | Admitting: Otolaryngology

## 2019-08-08 DIAGNOSIS — R590 Localized enlarged lymph nodes: Secondary | ICD-10-CM

## 2019-08-09 ENCOUNTER — Other Ambulatory Visit: Payer: Self-pay | Admitting: Physician Assistant

## 2019-08-09 DIAGNOSIS — K2100 Gastro-esophageal reflux disease with esophagitis, without bleeding: Secondary | ICD-10-CM

## 2019-08-13 ENCOUNTER — Ambulatory Visit
Admission: RE | Admit: 2019-08-13 | Discharge: 2019-08-13 | Disposition: A | Discharge status: Home / Self Care | Payer: BC Managed Care – PPO | Source: Ambulatory Visit | Attending: Internal Medicine | Admitting: Internal Medicine

## 2019-08-13 ENCOUNTER — Other Ambulatory Visit: Payer: Self-pay

## 2019-08-13 DIAGNOSIS — Z1231 Encounter for screening mammogram for malignant neoplasm of breast: Secondary | ICD-10-CM

## 2019-08-16 ENCOUNTER — Ambulatory Visit
Admission: RE | Admit: 2019-08-16 | Discharge: 2019-08-16 | Disposition: A | Discharge status: Home / Self Care | Payer: BC Managed Care – PPO | Source: Ambulatory Visit | Attending: Otolaryngology | Admitting: Otolaryngology

## 2019-08-16 DIAGNOSIS — R590 Localized enlarged lymph nodes: Secondary | ICD-10-CM

## 2019-08-16 DIAGNOSIS — R221 Localized swelling, mass and lump, neck: Secondary | ICD-10-CM | POA: Diagnosis not present

## 2019-08-16 MED ORDER — IOPAMIDOL (ISOVUE-300) INJECTION 61%
75.0000 mL | Freq: Once | INTRAVENOUS | Status: AC | PRN
  Administered 2019-08-16: 13:00:00 75 mL via INTRAVENOUS

## 2019-10-13 DIAGNOSIS — R300 Dysuria: Secondary | ICD-10-CM | POA: Diagnosis not present

## 2019-11-04 ENCOUNTER — Other Ambulatory Visit: Payer: Self-pay | Admitting: Internal Medicine

## 2019-11-04 DIAGNOSIS — E782 Mixed hyperlipidemia: Secondary | ICD-10-CM

## 2019-11-04 DIAGNOSIS — K2100 Gastro-esophageal reflux disease with esophagitis, without bleeding: Secondary | ICD-10-CM

## 2019-11-04 MED ORDER — ROSUVASTATIN CALCIUM 5 MG PO TABS: ORAL_TABLET | ORAL | 0 refills | Status: DC

## 2019-11-04 MED ORDER — FAMOTIDINE 40 MG PO TABS: ORAL_TABLET | ORAL | 0 refills | Status: DC

## 2019-11-07 ENCOUNTER — Other Ambulatory Visit: Payer: Self-pay | Admitting: Internal Medicine

## 2019-11-07 DIAGNOSIS — E782 Mixed hyperlipidemia: Secondary | ICD-10-CM

## 2019-11-09 ENCOUNTER — Ambulatory Visit: Payer: BC Managed Care – PPO | Attending: Internal Medicine

## 2019-11-09 DIAGNOSIS — Z23 Encounter for immunization: Secondary | ICD-10-CM

## 2019-11-09 NOTE — Progress Notes (Signed)
   Covid-19 Vaccination Clinic  Name:  Ashley Carpenter    MRN: [id number] DOB: 06/05/1966  11/09/2019  [name] was observed post Covid-19 immunization for 15 minutes without incident. She was provided with Vaccine Information Sheet and instruction to access the V-Safe system.   [name] was instructed to call 911 with any severe reactions post vaccine: Marland Kitchen Difficulty breathing  . Swelling of face and throat  . A fast heartbeat  . A bad rash all over body  . Dizziness and weakness   Immunizations Administered    Name Date Dose VIS Date Route   Pfizer COVID-19 Vaccine 11/09/2019  8:24 AM 0.3 mL 07/20/2019 Intramuscular   Manufacturer: [place]   Lot: [id number]   [place]: [id number]

## 2019-11-12 ENCOUNTER — Other Ambulatory Visit: Payer: Self-pay | Admitting: Physician Assistant

## 2019-11-12 DIAGNOSIS — K2100 Gastro-esophageal reflux disease with esophagitis, without bleeding: Secondary | ICD-10-CM

## 2019-11-12 MED ORDER — FAMOTIDINE 40 MG PO TABS: ORAL_TABLET | ORAL | 0 refills | Status: DC

## 2019-11-13 ENCOUNTER — Other Ambulatory Visit: Payer: Self-pay | Admitting: Internal Medicine

## 2019-11-13 DIAGNOSIS — E782 Mixed hyperlipidemia: Secondary | ICD-10-CM

## 2019-11-13 DIAGNOSIS — K2100 Gastro-esophageal reflux disease with esophagitis, without bleeding: Secondary | ICD-10-CM

## 2019-11-13 MED ORDER — FAMOTIDINE 40 MG PO TABS: ORAL_TABLET | ORAL | 0 refills | Status: DC

## 2019-11-13 MED ORDER — ROSUVASTATIN CALCIUM 5 MG PO TABS: ORAL_TABLET | ORAL | 0 refills | Status: DC

## 2019-12-03 ENCOUNTER — Ambulatory Visit: Payer: BC Managed Care – PPO | Attending: Internal Medicine

## 2019-12-03 DIAGNOSIS — Z23 Encounter for immunization: Secondary | ICD-10-CM

## 2019-12-03 NOTE — Progress Notes (Signed)
   Covid-19 Vaccination Clinic  Name:  Ashley Carpenter    MRN: [id number] DOB: Feb 05, 1966  12/03/2019  [name] was observed post Covid-19 immunization for 15 minutes without incident. She was provided with Vaccine Information Sheet and instruction to access the V-Safe system.   [name] was instructed to call 911 with any severe reactions post vaccine: Marland Kitchen Difficulty breathing  . Swelling of face and throat  . A fast heartbeat  . A bad rash all over body  . Dizziness and weakness   Immunizations Administered    Name Date Dose VIS Date Route   Pfizer COVID-19 Vaccine 12/03/2019 11:37 AM 0.3 mL 10/03/2018 Intramuscular   Manufacturer: [place]   Lot: [id number]   [place]: [id number]

## 2020-02-05 DIAGNOSIS — N3000 Acute cystitis without hematuria: Secondary | ICD-10-CM | POA: Diagnosis not present

## 2020-02-05 DIAGNOSIS — R300 Dysuria: Secondary | ICD-10-CM | POA: Diagnosis not present

## 2020-02-18 ENCOUNTER — Other Ambulatory Visit: Payer: Self-pay | Admitting: Internal Medicine

## 2020-02-18 ENCOUNTER — Other Ambulatory Visit: Payer: Self-pay | Admitting: Physician Assistant

## 2020-02-18 DIAGNOSIS — K2100 Gastro-esophageal reflux disease with esophagitis, without bleeding: Secondary | ICD-10-CM

## 2020-02-18 DIAGNOSIS — E782 Mixed hyperlipidemia: Secondary | ICD-10-CM

## 2020-02-19 DIAGNOSIS — R590 Localized enlarged lymph nodes: Secondary | ICD-10-CM | POA: Diagnosis not present

## 2020-04-02 DIAGNOSIS — H26491 Other secondary cataract, right eye: Secondary | ICD-10-CM | POA: Diagnosis not present

## 2020-04-02 DIAGNOSIS — H26493 Other secondary cataract, bilateral: Secondary | ICD-10-CM | POA: Diagnosis not present

## 2020-06-10 ENCOUNTER — Ambulatory Visit: Payer: BC Managed Care – PPO | Admitting: Adult Health Nurse Practitioner

## 2020-06-10 ENCOUNTER — Other Ambulatory Visit: Payer: Self-pay

## 2020-06-10 ENCOUNTER — Encounter: Payer: Self-pay | Admitting: Adult Health Nurse Practitioner

## 2020-06-10 VITALS — BP 102/60 | HR 63 | Temp 97.3°F | Ht 64.0 in | Wt 154.8 lb

## 2020-06-10 DIAGNOSIS — I10 Essential (primary) hypertension: Secondary | ICD-10-CM

## 2020-06-10 DIAGNOSIS — Z136 Encounter for screening for cardiovascular disorders: Secondary | ICD-10-CM

## 2020-06-10 DIAGNOSIS — Z130 Encounter for screening for diseases of the blood and blood-forming organs and certain disorders involving the immune mechanism: Secondary | ICD-10-CM

## 2020-06-10 DIAGNOSIS — Z23 Encounter for immunization: Secondary | ICD-10-CM

## 2020-06-10 DIAGNOSIS — Z1389 Encounter for screening for other disorder: Secondary | ICD-10-CM

## 2020-06-10 DIAGNOSIS — Z0000 Encounter for general adult medical examination without abnormal findings: Secondary | ICD-10-CM

## 2020-06-10 DIAGNOSIS — Z79899 Other long term (current) drug therapy: Secondary | ICD-10-CM | POA: Diagnosis not present

## 2020-06-10 DIAGNOSIS — Z131 Encounter for screening for diabetes mellitus: Secondary | ICD-10-CM | POA: Diagnosis not present

## 2020-06-10 DIAGNOSIS — M5412 Radiculopathy, cervical region: Secondary | ICD-10-CM

## 2020-06-10 DIAGNOSIS — K2100 Gastro-esophageal reflux disease with esophagitis, without bleeding: Secondary | ICD-10-CM

## 2020-06-10 DIAGNOSIS — Z1321 Encounter for screening for nutritional disorder: Secondary | ICD-10-CM

## 2020-06-10 DIAGNOSIS — Z13220 Encounter for screening for lipoid disorders: Secondary | ICD-10-CM

## 2020-06-10 DIAGNOSIS — Z1329 Encounter for screening for other suspected endocrine disorder: Secondary | ICD-10-CM | POA: Diagnosis not present

## 2020-06-10 DIAGNOSIS — E782 Mixed hyperlipidemia: Secondary | ICD-10-CM

## 2020-06-10 DIAGNOSIS — E559 Vitamin D deficiency, unspecified: Secondary | ICD-10-CM

## 2020-06-10 DIAGNOSIS — Z0001 Encounter for general adult medical examination with abnormal findings: Secondary | ICD-10-CM

## 2020-06-10 NOTE — Patient Instructions (Addendum)
Increase your water intake, increase by one bottle at a time.      Ask insurance and pharmacy about shingrix - it is a 2 part shot that we will not be getting in the office.   Suggest getting AFTER covid vaccines, have to wait at least a month This shot can make you feel bad due to such good immune response it can trigger some inflammation so take tylenol or aleve day of or day after and plan on resting.   Can go to [email or web address] for more information  Shingrix Vaccination  Two vaccines are licensed and recommended to prevent shingles in the [place] Zoster vaccine live (ZVL, Zostavax) has been in use since 2006. Recombinant zoster vaccine (RZV, Shingrix), has been in use since 2017 and is recommended by [employer] as the preferred shingles vaccine.  What Everyone Should Know about Shingles Vaccine (Shingrix) One of the Recommended Vaccines by Disease Shingles vaccination is the only way to protect against shingles and postherpetic neuralgia (PHN), the most common complication from shingles. CDC recommends that healthy adults 50 years and older get two doses of the shingles vaccine called Shingrix (recombinant zoster vaccine), separated by 2 to 6 months, to prevent shingles and the complications from the disease. Your doctor or pharmacist can give you Shingrix as a shot in your upper arm. Shingrix provides strong protection against shingles and PHN. Two doses of Shingrix is more than 90% effective at preventing shingles and PHN. Protection stays above 85% for at least the first four years after you get vaccinated. Shingrix is the preferred vaccine, over Zostavax (zoster vaccine live), a shingles vaccine in use since 2006. Zostavax may still be used to prevent shingles in healthy adults 60 years and older. For example, you could use Zostavax if a person is allergic to Shingrix, prefers Zostavax, or requests immediate vaccination and Shingrix is  unavailable. Who Should Get Shingrix? Healthy adults 50 years and older should get two doses of Shingrix, separated by 2 to 6 months. You should get Shingrix even if in the past you . had shingles  . received Zostavax  . are not sure if you had chickenpox There is no maximum age for getting Shingrix. If you had shingles in the past, you can get Shingrix to help prevent future occurrences of the disease. There is no specific length of time that you need to wait after having shingles before you can receive Shingrix, but generally you should make sure the shingles rash has gone away before getting vaccinated. You can get Shingrix whether or not you remember having had chickenpox in the past. Studies show that more than 99% of Americans 40 years and older have had chickenpox, even if they don't remember having the disease. Chickenpox and shingles are related because they are caused by the same virus (varicella zoster virus). After a person recovers from chickenpox, the virus stays dormant (inactive) in the body. It can reactivate years later and cause shingles. If you had Zostavax in the recent past, you should wait at least eight weeks before getting Shingrix. Talk to your healthcare provider to determine the best time to get Shingrix. Shingrix is available in [employer] and pharmacies. To find doctor's offices or pharmacies near you that offer the vaccine, visit [employer] Vaccine FinderExternal. If you have questions about Shingrix, talk with your healthcare provider. Vaccine for Those 96 Years and Older  Shingrix reduces the risk of shingles and PHN by more than 90% in people 58 and older.  CDC recommends the vaccine for healthy adults 36 and older.  Who Should Not Get Shingrix? You should not get Shingrix if you: . have ever had a severe allergic reaction to any component of the vaccine or after a dose of Shingrix  . tested negative for immunity to varicella zoster virus. If you test negative,  you should get chickenpox vaccine.  . currently have shingles  . currently are pregnant or breastfeeding. Women who are pregnant or breastfeeding should wait to get Shingrix.  Marland Kitchen receive specific antiviral drugs (acyclovir, famciclovir, or valacyclovir) 24 hours before vaccination (avoid use of these antiviral drugs for 14 days after vaccination)- zoster vaccine live only If you have a minor acute (starts suddenly) illness, such as a cold, you may get Shingrix. But if you have a moderate or severe acute illness, you should usually wait until you recover before getting the vaccine. This includes anyone with a temperature of 101.58F or higher. The side effects of the Shingrix are temporary, and usually last 2 to 3 days. While you may experience pain for a few days after getting Shingrix, the pain will be less severe than having shingles and the complications from the disease. How Well Does Shingrix Work? Two doses of Shingrix provides strong protection against shingles and postherpetic neuralgia (PHN), the most common complication of shingles. . In adults 22 to 55 years old who got two doses, Shingrix was 97% effective in preventing shingles; among adults 70 years and older, Shingrix was 91% effective.  . In adults 27 to 55 years old who got two doses, Shingrix was 91% effective in preventing PHN; among adults 70 years and older, Shingrix was 89% effective. Shingrix protection remained high (more than 85%) in people 70 years and older throughout the four years following vaccination. Since your risk of shingles and PHN increases as you get older, it is important to have strong protection against shingles in your older years. Top of Page  What Are the Possible Side Effects of Shingrix? Studies show that Shingrix is safe. The vaccine helps your body create a strong defense against shingles. As a result, you are likely to have temporary side effects from getting the shots. The side effects may affect your  ability to do normal daily activities for 2 to 3 days. Most people got a sore arm with mild or moderate pain after getting Shingrix, and some also had redness and swelling where they got the shot. Some people felt tired, had muscle pain, a headache, shivering, fever, stomach pain, or nausea. About 1 out of 6 people who got Shingrix experienced side effects that prevented them from doing regular activities. Symptoms went away on their own in about 2 to 3 days. Side effects were more common in younger people. You might have a reaction to the first or second dose of Shingrix, or both doses. If you experience side effects, you may choose to take over-the-counter pain medicine such as ibuprofen or acetaminophen. If you experience side effects from Shingrix, you should report them to the Vaccine Adverse Event Reporting System (VAERS). Your doctor might file this report, or you can do it yourself through the VAERS websiteExternal, or by calling [phone]. If you have any questions about side effects from Shingrix, talk with your doctor. The shingles vaccine does not contain thimerosal (a preservative containing mercury). Top of Page  When Should I See a Doctor Because of the Side Effects I Experience From Shingrix? In clinical trials, Shingrix was not associated with serious  adverse events. In fact, serious side effects from vaccines are extremely rare. For example, for every 1 million doses of a vaccine given, only one or two people may have a severe allergic reaction. Signs of an allergic reaction happen within minutes or hours after vaccination and include hives, swelling of the face and throat, difficulty breathing, a fast heartbeat, dizziness, or weakness. If you experience these or any other life-threatening symptoms, see a doctor right away. Shingrix causes a strong response in your immune system, so it may produce short-term side effects more intense than you are used to from other vaccines. These  side effects can be uncomfortable, but they are expected and usually go away on their own in 2 or 3 days. Top of Page  How Can I Pay For Shingrix? There are several ways shingles vaccine may be paid for: Medicare . Medicare Part D plans cover the shingles vaccine, but there may be a cost to you depending on your plan. There may be a copay for the vaccine, or you may need to pay in full then get reimbursed for a certain amount.  . Medicare Part B does not cover the shingles vaccine. Medicaid . Medicaid may or may not cover the vaccine. Contact your insurer to find out. Private health insurance . Many private health insurance plans will cover the vaccine, but there may be a cost to you depending on your plan. Contact your insurer to find out. Vaccine assistance programs . Some pharmaceutical companies provide vaccines to eligible adults who cannot afford them. You may want to check with the vaccine manufacturer, [employer], about Shingrix. If you do not currently have health insurance, learn more about affordable health coverage optionsExternal. To find doctor's offices or pharmacies near you that offer the vaccine, visit HealthMap Vaccine FinderExternal.

## 2020-06-10 NOTE — Progress Notes (Signed)
COMPLETE PHYSICAL  Assessment and Plan:  Encounter for general adult medical examination with abnormal findings -     CBC with Differential/Platelet -     COMPLETE METABOLIC PANEL WITH GFR -     TSH -     Lipid panel -     Hemoglobin A1c -     Magnesium -     VITAMIN D 25 Hydroxy (Vit-D Deficiency, Fractures) -     Urinalysis, Routine w reflex microscopic -     Microalbumin / creatinine urine ratio -     Iron,Total/Total Iron Binding Cap -     Vitamin B12 -     EKG 12-Lead -     MICROSCOPIC MESSAGE  Mixed hyperlipidemia Continue rosuvastatin 5mg  nightly decrease fatty foods increase activity.  -     Lipid panel  Medication management -     CBC with Differential/Platelet -     COMPLETE METABOLIC PANEL WITH GFR -     Magnesium  Cervical radiculitis Monitor  Occipital neuralgia of right side Monitor  Vitamin D deficiency -     VITAMIN D 25 Hydroxy (Vit-D Deficiency, Fractures)  Screening for thyroid disorder -     TSH  Screening for hematuria or proteinuria -     Urinalysis, Routine w reflex microscopic -     Microalbumin / creatinine urine ratio  Screening for deficiency anemia -     Iron,Total/Total Iron Binding Cap -     Vitamin B12  Needs flu shot -     FLU VACCINE MDCK QUAD W/Preservative  Screening for diabetes mellitus -     Hemoglobin A1c  Screening for cardiovascular condition -     EKG 12-Lead   Gastroesophageal reflux disease with esophagitis without hemorrhage Doing well at this time Continue: famotidine 40mg  Diet discussed Monitor for triggers Avoid food with high acid content Avoid excessive cafeine Increase water intake    Discussed med's effects and SE's. Screening labs and tests as requested with regular follow-up as recommended. Over 40 minutes of face to face interview exam, counseling, chart review, and complex, high level critical decision making was performed this visit.   HPI  54 y.o. female  presents for a complete  physical and follow up for has Vitamin D deficiency; Medication management; Mixed hyperlipidemia; Cervical radiculitis; Occipital neuralgia of right side; and Elevated lipoprotein A level on their problem list..  Reports overall she is doing well.  She is concerned about frequent uti's.  Reports she has had three over the course of the past three years.  She is following with Dr. Janace Hoard for right cervical mass/adenopathy, no night sweats, no weight loss. No other adenopathy. She has been having a globulus sensation in her throat but intermittent and she does have GERD. No ETOH or NSAIDS. No black stool, blood in stool.   Her blood pressure has been controlled at home, today their BP is BP: 102/60 She does workout. She denies chest pain, shortness of breath, dizziness.   She is not on cholesterol medication. Her cholesterol is not at goal. She is trying to move more. History of dad with MI in 83's, PGM bypass in her 15's, both smokers. The cholesterol last visit was:   Lab Results  Component Value Date   CHOL 280 (H) 06/07/2019   HDL 64 06/07/2019   LDLCALC 193 (H) 06/07/2019   TRIG 107 06/07/2019   CHOLHDL 4.4 06/07/2019    Last A1C in the office was:  Lab Results  Component  Value Date   HGBA1C 4.6 06/07/2019   Patient is on Vitamin D supplement.   Lab Results  Component Value Date   VD25OH 67 06/07/2019     BMI is Body mass index is 26.57 kg/m., she is working on diet and exercise. Wt Readings from Last 3 Encounters:  06/10/20 154 lb 12.8 oz (70.2 kg)  06/07/19 153 lb 6.4 oz (69.6 kg)  04/04/19 150 lb 3.2 oz (68.1 kg)   She has stress, teenage daughter, Ashley Carpenter.   Current Medications:  Current Outpatient Medications on File Prior to Visit  Medication Sig Dispense Refill  . famotidine (PEPCID) 40 MG tablet TAKE 1 TABLET DAILY FOR INDIGESTION, HEARTBURN, AND REFLUX 90 tablet 3  . rosuvastatin (CRESTOR) 5 MG tablet TAKE 1 TABLET DAILY FOR CHOLESTEROL 90 tablet 3   No current  facility-administered medications on file prior to visit.   Allergies:  No Known Allergies  Medical History:  She has Vitamin D deficiency; Medication management; Mixed hyperlipidemia; Cervical radiculitis; Occipital neuralgia of right side; and Elevated lipoprotein A level on their problem list.  Health Maintenance:   Immunization History  Administered Date(s) Administered  . Influenza Inj Mdck Quad With Preservative 06/07/2019, 06/10/2020  . Influenza Split 04/15/2011  . PFIZER SARS-COV-2 Vaccination 11/09/2019, 12/03/2019  . 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   No LMP recorded. Patient is postmenopausal. Pap: 2015 neg HPV, DUE MGM:08/2019  DEXA: due age 25 Colonoscopy: 09/2015 10 years  US neck 03/2019: IMPRESSION: 1 cm soft tissue lesion within the superficial soft tissues of the right neck in the region of clinical concern. Characteristics are nonspecific, with both malignant and benign etiology on the differential diagnosis.  Patient Care Team: Unk Pinto, MD as PCP - General (Internal Medicine) Pcp, No  Surgical History:  She has a past surgical history that includes Cesarean section (2002). Family History:  Herfamily history includes Cancer in her maternal grandmother and mother; Colon polyps in her father; Diabetes in her father and paternal grandfather; Heart disease in her father and maternal grandfather; Hyperlipidemia in her mother; Hypertension in her father and maternal grandfather. Social History:  She reports that she quit smoking about 36 years ago. Her smoking use included cigarettes. She smoked 0.25 packs per day. She has never used smokeless tobacco. She reports current alcohol use of about 1.0 standard drink of alcohol per week. She reports that she does not use drugs.  Review of Systems: Review  of Systems  Constitutional: Negative.   HENT: Negative.   Eyes: Negative.   Respiratory: Negative.   Cardiovascular: Negative.   Gastrointestinal: Negative.   Genitourinary: Negative.   Musculoskeletal: Negative.   Skin: Negative.     Physical Exam: Estimated body mass index is 26.57 kg/m as calculated from the following:   Height as of this encounter: 5\' 4"  (1.626 m).   Weight as of this encounter: 154 lb 12.8 oz (70.2 kg). BP 102/60   Pulse 63   Temp (!) 97.3 F (36.3 C)   Ht 5\' 4"  (1.626 m)   Wt 154 lb 12.8 oz (70.2 kg)   SpO2 98%   BMI 26.57 kg/m  General Appearance: Well nourished, in no apparent distress.  Eyes: PERRLA, EOMs, conjunctiva no swelling or erythema, normal fundi and vessels.  Sinuses: No Frontal/maxillary tenderness  ENT/Mouth: Ext aud canals clear, normal light reflex with TMs without erythema, bulging.  Good dentition. No erythema, swelling, or exudate on post pharynx. Tonsils not swollen or erythematous. Hearing normal.  Neck: Supple, thyroid normal. No bruits  Respiratory: Respiratory effort normal, BS equal bilaterally without rales, rhonchi, wheezing or stridor.  Cardio: RRR without murmurs, rubs or gallops. Brisk peripheral pulses without edema.  Chest: symmetric, with normal excursions and percussion.  Breasts: Symmetric, without lumps, nipple discharge, retractions.  Abdomen: Soft, nontender, no guarding, rebound, hernias, masses, or organomegaly.  Lymphatics: Non tender without lymphadenopathy. Left cervical lymph node, marble size. Genitourinary: defer Musculoskeletal: Full ROM all peripheral extremities,5/5 strength, and normal gait.  Skin: lipoma right anterior medial thigh. Warm, dry without rashes, lesions, ecchymosis. Neuro: Cranial nerves intact, reflexes equal bilaterally. Normal muscle tone, no cerebellar symptoms. Sensation intact.  Psych: Awake and oriented X 3, normal affect, Insight and Judgment appropriate.    EKG: WNL NSR  no ST  changes.  AORTA SCAN: defer   Ashley Males, DNP [facility] 06/10/2020  4:35 PM

## 2020-06-12 ENCOUNTER — Other Ambulatory Visit: Payer: Self-pay | Admitting: Adult Health Nurse Practitioner

## 2020-06-12 DIAGNOSIS — N390 Urinary tract infection, site not specified: Secondary | ICD-10-CM

## 2020-06-12 DIAGNOSIS — B9620 Unspecified Escherichia coli [E. coli] as the cause of diseases classified elsewhere: Secondary | ICD-10-CM

## 2020-06-12 MED ORDER — SULFAMETHOXAZOLE-TRIMETHOPRIM 800-160 MG PO TABS: 1.0000 | ORAL_TABLET | Freq: Two times a day (BID) | ORAL | 0 refills | Status: DC

## 2020-06-13 LAB — LIPID PANEL
Cholesterol: 176 mg/dL (ref <200)
HDL: 59 mg/dL (ref >=50)
LDL Cholesterol (Calc): 92 mg/dL (calc)
Non-HDL Cholesterol (Calc): 117 mg/dL (calc) (ref <130)
Total CHOL/HDL Ratio: 3 (calc) (ref <5.0)
Triglycerides: 153 mg/dL — ABNORMAL HIGH (ref <150)

## 2020-06-13 LAB — CBC WITH DIFFERENTIAL/PLATELET
Absolute Monocytes: 343 cells/uL (ref 200–950)
Basophils Absolute: 40 cells/uL (ref 0–200)
Basophils Relative: 0.9 %
Eosinophils Absolute: 101 cells/uL (ref 15–500)
Eosinophils Relative: 2.3 %
HCT: 39.2 % (ref 35.0–45.0)
Hemoglobin: 13.5 g/dL (ref 11.7–15.5)
Lymphs Abs: 1676 cells/uL (ref 850–3900)
MCH: 32 pg (ref 27.0–33.0)
MCHC: 34.4 g/dL (ref 32.0–36.0)
MCV: 92.9 fL (ref 80.0–100.0)
MPV: 10 fL (ref 7.5–12.5)
Monocytes Relative: 7.8 %
Neutro Abs: 2240 cells/uL (ref 1500–7800)
Neutrophils Relative %: 50.9 %
Platelets: 309 10*3/uL (ref 140–400)
RBC: 4.22 10*6/uL (ref 3.80–5.10)
RDW: 12 % (ref 11.0–15.0)
Total Lymphocyte: 38.1 %
WBC: 4.4 10*3/uL (ref 3.8–10.8)

## 2020-06-13 LAB — COMPLETE METABOLIC PANEL WITH GFR
AG Ratio: 1.8 (calc) (ref 1.0–2.5)
ALT: 18 U/L (ref 6–29)
AST: 17 U/L (ref 10–35)
Albumin: 4.4 g/dL (ref 3.6–5.1)
Alkaline phosphatase (APISO): 71 U/L (ref 37–153)
BUN: 10 mg/dL (ref 7–25)
CO2: 29 mmol/L (ref 20–32)
Calcium: 10.2 mg/dL (ref 8.6–10.4)
Chloride: 106 mmol/L (ref 98–110)
Creat: 0.62 mg/dL (ref 0.50–1.05)
GFR, Est African American: 118 mL/min/{1.73_m2} (ref >=60)
GFR, Est Non African American: 102 mL/min/{1.73_m2} (ref >=60)
Globulin: 2.4 g/dL (calc) (ref 1.9–3.7)
Glucose, Bld: 88 mg/dL (ref 65–99)
Potassium: 4 mmol/L (ref 3.5–5.3)
Sodium: 142 mmol/L (ref 135–146)
Total Bilirubin: 0.5 mg/dL (ref 0.2–1.2)
Total Protein: 6.8 g/dL (ref 6.1–8.1)

## 2020-06-13 LAB — URINE CULTURE
MICRO NUMBER:: 11150400
SPECIMEN QUALITY:: ADEQUATE

## 2020-06-13 LAB — URINALYSIS W MICROSCOPIC + REFLEX CULTURE
Bacteria, UA: NONE SEEN /HPF
Bilirubin Urine: NEGATIVE
Glucose, UA: NEGATIVE
Hgb urine dipstick: NEGATIVE
Hyaline Cast: NONE SEEN /LPF
Ketones, ur: NEGATIVE
Nitrites, Initial: NEGATIVE
Protein, ur: NEGATIVE
RBC / HPF: NONE SEEN /HPF (ref 0–2)
Specific Gravity, Urine: 1.013 (ref 1.001–1.03)
WBC, UA: NONE SEEN /HPF (ref 0–5)
pH: 7.5 (ref 5.0–8.0)

## 2020-06-13 LAB — MAGNESIUM: Magnesium: 2 mg/dL (ref 1.5–2.5)

## 2020-06-13 LAB — VITAMIN B12: Vitamin B-12: 377 pg/mL (ref 200–1100)

## 2020-06-13 LAB — HEMOGLOBIN A1C
Hgb A1c MFr Bld: 4.9 % of total Hgb (ref <5.7)
Mean Plasma Glucose: 94 (calc)
eAG (mmol/L): 5.2 (calc)

## 2020-06-13 LAB — IRON, TOTAL/TOTAL IRON BINDING CAP
%SAT: 18 % (calc) (ref 16–45)
Iron: 63 ug/dL (ref 45–160)
TIBC: 348 mcg/dL (calc) (ref 250–450)

## 2020-06-13 LAB — TSH: TSH: 1.5 mIU/L

## 2020-06-13 LAB — CULTURE INDICATED

## 2020-07-14 ENCOUNTER — Other Ambulatory Visit: Payer: Self-pay | Admitting: Internal Medicine

## 2020-07-14 DIAGNOSIS — Z1231 Encounter for screening mammogram for malignant neoplasm of breast: Secondary | ICD-10-CM

## 2020-07-29 ENCOUNTER — Encounter: Payer: Self-pay | Admitting: Adult Health Nurse Practitioner

## 2020-07-29 ENCOUNTER — Ambulatory Visit (INDEPENDENT_AMBULATORY_CARE_PROVIDER_SITE_OTHER): Payer: BC Managed Care – PPO | Admitting: Adult Health Nurse Practitioner

## 2020-07-29 ENCOUNTER — Other Ambulatory Visit: Payer: Self-pay

## 2020-07-29 VITALS — BP 128/86 | HR 83 | Temp 98.1°F | Ht 64.0 in | Wt 155.0 lb

## 2020-07-29 DIAGNOSIS — R310 Gross hematuria: Secondary | ICD-10-CM | POA: Diagnosis not present

## 2020-07-29 DIAGNOSIS — R300 Dysuria: Secondary | ICD-10-CM | POA: Diagnosis not present

## 2020-07-29 DIAGNOSIS — R350 Frequency of micturition: Secondary | ICD-10-CM

## 2020-07-29 NOTE — Progress Notes (Signed)
Assessment and Plan:  Ashley Carpenter was seen today for hematuria.  Diagnoses and all orders for this visit:  Dysuria Gross hematuria Frequency of micturation Continue increased water intake. Monitor symptoms Contact office with any new or worsening symptoms -     Urine Culture -     Urinalysis, Routine w reflex microscopic   Discussed vaginal dryness, rule out bacteria causation.  Consider OTC Replense.  Patient is agreeable to this plan of care.  Further disposition pending results of labs. Discussed med's effects and SE's.   Over 30 minutes of face to face interview, exam, counseling, chart review, and critical decision making was performed.   Future Appointments  Date Time Provider Anthon  08/22/2020  7:50 AM [facility] MM 2 [facility]  10/02/2020  3:30 PM Tyray Proch, Danton Sewer, NP [facility] None  06/10/2021  3:00 PM Dolan Xia, NP [facility] None    ------------------------------------------------------------------------------------------------------------------   HPI 54 y.o.female presents for blood in urine that started today.  She reports that there was bright red blood on the tissue when she wiped today.  She reports at times she feels like she is starting to get a urinary tract infection.  She reports she is not having any dysuria but had similar symptoms over one year ago.  Last OV was on 06/10/20. She was having similar symptoms and was treated with Bactrim for E-coli.    Reports she is sexually active but it has been over a months since last encounter, monogamous. Discussed vaginal dryness with patient.  She reports it could be related to this?  She denies any abdominal pain, nausea, vomiting, constipation or diarrhea. Has not a period for over 10 years, post menopausal.  Past Medical History:  Diagnosis Date   Allergy      No Known Allergies  Current Outpatient Medications on File Prior to Visit  Medication Sig   famotidine (PEPCID) 40 MG  tablet TAKE 1 TABLET DAILY FOR INDIGESTION, HEARTBURN, AND REFLUX   rosuvastatin (CRESTOR) 5 MG tablet TAKE 1 TABLET DAILY FOR CHOLESTEROL   No current facility-administered medications on file prior to visit.    ROS: all negative except above.   Physical Exam:  BP 128/86    Pulse 83    Temp 98.1 F (36.7 C)    Ht 5\' 4"  (1.626 m)    Wt 155 lb (70.3 kg)    SpO2 98%    BMI 26.61 kg/m   General Appearance: Well nourished, in no apparent distress. Eyes: PERRLA, EOMs, conjunctiva no swelling or erythema Sinuses: No Frontal/maxillary tenderness ENT/Mouth: Ext aud canals clear, TMs without erythema, bulging. No erythema, swelling, or exudate on post pharynx.  Tonsils not swollen or erythematous. Hearing normal.  Neck: Supple, thyroid normal.  Respiratory: Respiratory effort normal, BS equal bilaterally without rales, rhonchi, wheezing or stridor.  Cardio: RRR with no MRGs. Brisk peripheral pulses without edema.  Abdomen: Soft, + BS.  Non tender, no guarding, rebound, hernias, masses. Lymphatics: Non tender without lymphadenopathy.  Musculoskeletal: Full ROM, 5/5 strength, normal gait.  Skin: Warm, dry without rashes, lesions, ecchymosis.  Neuro: Cranial nerves intact. Normal muscle tone, no cerebellar symptoms. Sensation intact.  Psych: Awake and oriented X 3, normal affect, Insight and Judgment appropriate.       Garnet Sierras, Laqueta Jean, DNP [facility] 07/29/2020  4:11 PM

## 2020-07-30 LAB — URINALYSIS, ROUTINE W REFLEX MICROSCOPIC
Bacteria, UA: NONE SEEN /HPF
Bilirubin Urine: NEGATIVE
Glucose, UA: NEGATIVE
Hyaline Cast: NONE SEEN /LPF
Ketones, ur: NEGATIVE
Nitrite: NEGATIVE
Protein, ur: NEGATIVE
RBC / HPF: NONE SEEN /HPF (ref 0–2)
Specific Gravity, Urine: 1.004 (ref 1.001–1.03)
Squamous Epithelial / HPF: NONE SEEN /HPF (ref <=5)
pH: <=5 (ref 5.0–8.0)

## 2020-07-30 LAB — URINE CULTURE
MICRO NUMBER:: 11345868
SPECIMEN QUALITY:: ADEQUATE

## 2020-08-22 ENCOUNTER — Ambulatory Visit: Payer: BC Managed Care – PPO

## 2020-10-02 ENCOUNTER — Ambulatory Visit: Payer: BC Managed Care – PPO | Admitting: Adult Health Nurse Practitioner

## 2020-10-02 ENCOUNTER — Encounter: Payer: Self-pay | Admitting: Adult Health Nurse Practitioner

## 2020-10-02 ENCOUNTER — Other Ambulatory Visit: Payer: Self-pay

## 2020-10-02 VITALS — BP 118/72 | HR 67 | Temp 97.5°F | Ht 64.0 in | Wt 157.2 lb

## 2020-10-02 DIAGNOSIS — K2100 Gastro-esophageal reflux disease with esophagitis, without bleeding: Secondary | ICD-10-CM | POA: Diagnosis not present

## 2020-10-02 DIAGNOSIS — E782 Mixed hyperlipidemia: Secondary | ICD-10-CM

## 2020-10-02 DIAGNOSIS — Z79899 Other long term (current) drug therapy: Secondary | ICD-10-CM

## 2020-10-02 DIAGNOSIS — E559 Vitamin D deficiency, unspecified: Secondary | ICD-10-CM | POA: Diagnosis not present

## 2020-10-02 LAB — COMPLETE METABOLIC PANEL WITH GFR
AG Ratio: 1.7 (calc) (ref 1.0–2.5)
ALT: 15 U/L (ref 6–29)
AST: 16 U/L (ref 10–35)
Albumin: 4.3 g/dL (ref 3.6–5.1)
Alkaline phosphatase (APISO): 66 U/L (ref 37–153)
BUN: 16 mg/dL (ref 7–25)
CO2: 30 mmol/L (ref 20–32)
Calcium: 10 mg/dL (ref 8.6–10.4)
Chloride: 104 mmol/L (ref 98–110)
Creat: 0.68 mg/dL (ref 0.50–1.05)
GFR, Est African American: 114 mL/min/{1.73_m2} (ref >=60)
GFR, Est Non African American: 98 mL/min/{1.73_m2} (ref >=60)
Globulin: 2.6 g/dL (calc) (ref 1.9–3.7)
Glucose, Bld: 91 mg/dL (ref 65–99)
Potassium: 3.9 mmol/L (ref 3.5–5.3)
Sodium: 140 mmol/L (ref 135–146)
Total Bilirubin: 0.5 mg/dL (ref 0.2–1.2)
Total Protein: 6.9 g/dL (ref 6.1–8.1)

## 2020-10-02 LAB — CBC WITH DIFFERENTIAL/PLATELET
Absolute Monocytes: 365 cells/uL (ref 200–950)
Basophils Absolute: 30 cells/uL (ref 0–200)
Basophils Relative: 0.8 %
Eosinophils Absolute: 72 cells/uL (ref 15–500)
Eosinophils Relative: 1.9 %
HCT: 38.9 % (ref 35.0–45.0)
Hemoglobin: 13.4 g/dL (ref 11.7–15.5)
Lymphs Abs: 1721 cells/uL (ref 850–3900)
MCH: 31.6 pg (ref 27.0–33.0)
MCHC: 34.4 g/dL (ref 32.0–36.0)
MCV: 91.7 fL (ref 80.0–100.0)
MPV: 10 fL (ref 7.5–12.5)
Monocytes Relative: 9.6 %
Neutro Abs: 1611 cells/uL (ref 1500–7800)
Neutrophils Relative %: 42.4 %
Platelets: 285 10*3/uL (ref 140–400)
RBC: 4.24 10*6/uL (ref 3.80–5.10)
RDW: 12 % (ref 11.0–15.0)
Total Lymphocyte: 45.3 %
WBC: 3.8 10*3/uL (ref 3.8–10.8)

## 2020-10-02 LAB — LIPID PANEL
Cholesterol: 169 mg/dL (ref <200)
HDL: 60 mg/dL (ref >=50)
LDL Cholesterol (Calc): 90 mg/dL (calc)
Non-HDL Cholesterol (Calc): 109 mg/dL (calc) (ref <130)
Total CHOL/HDL Ratio: 2.8 (calc) (ref <5.0)
Triglycerides: 99 mg/dL (ref <150)

## 2020-10-02 NOTE — Progress Notes (Signed)
FOLLOW UP 3 MONTH  Assessment and Plan:   Mixed hyperlipidemia Continue rosuvastatin 5mg  nightly decrease fatty foods increase activity.  -     Lipid panel   Vitamin D deficiency -     VITAMIN D 25 Hydroxy (Vit-D Deficiency, Fractures)   Gastroesophageal reflux disease with esophagitis without hemorrhage Doing well at this time Continue: famotidine 40mg , nightly Diet discussed Monitor for triggers Avoid food with high acid content Avoid excessive cafeine Increase water intake   Medication management Continued   Discussed med's effects and SE's. Screening labs and tests as requested with regular follow-up as recommended. Over 30 minutes of face to face interview exam, counseling, chart review, and complex, high level critical decision making was performed this visit.   HPI  56 y.o. female  presents for 3 month follow up for  has Vitamin D deficiency; Medication management; Mixed hyperlipidemia; Cervical radiculitis; Occipital neuralgia of right side; and Elevated lipoprotein A level on their problem list..   Reports overall she is doing well.    She ihas history frequent uti's.  Reports she has had three over the course of the past three years. None in the past three months. No concerns today.  She is following with Dr. Janace Hoard for right cervical mass/adenopathy.  She has had a CT soft tissue of neck 08/16/19. There is a 9x12mm lobular heterogeneous right level lll lymph node.  Repoeat imaging recommended if does not resolve. No night sweats, no weight loss. No other adenopathy. She has been having a globulus sensation in her throat but intermittent and she does have GERD. No ETOH or NSAIDS. No black stool, blood in stool.   Her blood pressure has been controlled at home, today their BP is BP: 118/72 She does workout. She denies chest pain, shortness of breath, dizziness.   She is on cholesterol medication rosuvastatin 5mg . Her cholesterol is not at goal. She is trying to move  more. History of dad with MI in 51's, PGM bypass in her 60's, both smokers. The cholesterol last visit was:   Lab Results  Component Value Date   CHOL 176 06/10/2020   HDL 59 06/10/2020   LDLCALC 92 06/10/2020   TRIG 153 (H) 06/10/2020   CHOLHDL 3.0 06/10/2020    Last A1C in the office was:  Lab Results  Component Value Date   HGBA1C 4.9 06/10/2020   Patient is on Vitamin D supplement.   Lab Results  Component Value Date   VD25OH 67 06/07/2019     BMI is Body mass index is 26.98 kg/m., she is working on diet and exercise. Wt Readings from Last 3 Encounters:  10/02/20 157 lb 3.2 oz (71.3 kg)  07/29/20 155 lb (70.3 kg)  06/10/20 154 lb 12.8 oz (70.2 kg)   She has stress, teenage daughter, Ashley Carpenter.   Current Medications:  Current Outpatient Medications on File Prior to Visit  Medication Sig Dispense Refill  . famotidine (PEPCID) 40 MG tablet TAKE 1 TABLET DAILY FOR INDIGESTION, HEARTBURN, AND REFLUX 90 tablet 3  . rosuvastatin (CRESTOR) 5 MG tablet TAKE 1 TABLET DAILY FOR CHOLESTEROL 90 tablet 3   No current facility-administered medications on file prior to visit.   Allergies:  No Known Allergies  Medical History:  She has Vitamin D deficiency; Medication management; Mixed hyperlipidemia; Cervical radiculitis; Occipital neuralgia of right side; and Elevated lipoprotein A level on their problem list.  Health Maintenance:   Immunization History  Administered Date(s) Administered  . Influenza Inj Mdck Quad With  Preservative 06/07/2019, 06/10/2020  . Influenza Split 04/15/2011  . PFIZER(Purple Top)SARS-COV-2 Vaccination 11/09/2019, 12/03/2019  . 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   No LMP recorded. Patient is postmenopausal. Pap: 2015 neg HPV, DUE get at [facility] MGM:08/2019  DEXA: due age 18 Colonoscopy: 09/2015 10  years  US neck 03/2019: IMPRESSION: 1 cm soft tissue lesion within the superficial soft tissues of the right neck in the region of clinical concern. Characteristics are nonspecific, with both malignant and benign etiology on the differential diagnosis.  Patient Care Team: Unk Pinto, MD as PCP - General (Internal Medicine) Pcp, No  Surgical History:  She has a past surgical history that includes Cesarean section (2002). Family History:  Herfamily history includes Cancer in her maternal grandmother and mother; Colon polyps in her father; Diabetes in her father and paternal grandfather; Heart disease in her father and maternal grandfather; Hyperlipidemia in her mother; Hypertension in her father and maternal grandfather. Social History:  She reports that she quit smoking about 37 years ago. Her smoking use included cigarettes. She smoked 0.25 packs per day. She has never used smokeless tobacco. She reports current alcohol use of about 1.0 standard drink of alcohol per week. She reports that she does not use drugs.  Review of Systems: Review of Systems  Constitutional: Negative.   HENT: Negative.   Eyes: Negative.   Respiratory: Negative.   Cardiovascular: Negative.   Gastrointestinal: Negative.   Genitourinary: Negative.   Musculoskeletal: Negative.   Skin: Negative.     Physical Exam: Estimated body mass index is 26.98 kg/m as calculated from the following:   Height as of this encounter: 5\' 4"  (1.626 m).   Weight as of this encounter: 157 lb 3.2 oz (71.3 kg). BP 118/72   Pulse 67   Temp (!) 97.5 F (36.4 C)   Ht 5\' 4"  (1.626 m)   Wt 157 lb 3.2 oz (71.3 kg)   SpO2 98%   BMI 26.98 kg/m   General Appearance: Well nourished, in no apparent distress.  Eyes: PERRLA, EOMs, conjunctiva no swelling or erythema, normal fundi and vessels.  Sinuses: No Frontal/maxillary tenderness  ENT/Mouth: Ext aud canals clear, normal light reflex with TMs without erythema, bulging. Good  dentition. No erythema, swelling, or exudate on post pharynx. Tonsils not swollen or erythematous. Hearing normal.  Neck: Supple, thyroid normal. No bruits  Right side marble size cervical lymphandepathy nodes, non-tender. Respiratory: Respiratory effort normal, BS equal bilaterally without rales, rhonchi, wheezing or stridor.  Cardio: RRR without murmurs, rubs or gallops. Brisk peripheral pulses without edema.  Chest: symmetric, with normal excursions and percussion.  Breasts: Symmetric, without lumps, nipple discharge, retractions.  Abdomen: Soft, nontender, no guarding, rebound, hernias, masses, or organomegaly.  Lymphatics: Non tender without lymphadenopathy. Left cervical lymph node, marble size. Genitourinary: defer Musculoskeletal: Full ROM all peripheral extremities,5/5 strength, and normal gait.  Skin: lipoma right anterior medial thigh. Warm, dry without rashes, lesions, ecchymosis. Neuro: Cranial nerves intact, reflexes equal bilaterally. Normal muscle tone, no cerebellar symptoms. Sensation intact.  Psych: Awake and oriented X 3, normal affect, Insight and Judgment appropriate.    Ashley Carpenter, Laqueta Jean, DNP [facility] 10/02/2020  3:53 PM

## 2020-11-03 ENCOUNTER — Other Ambulatory Visit: Payer: Self-pay

## 2020-11-03 ENCOUNTER — Ambulatory Visit
Admission: RE | Admit: 2020-11-03 | Discharge: 2020-11-03 | Disposition: A | Discharge status: Home / Self Care | Payer: BC Managed Care – PPO | Source: Ambulatory Visit | Attending: Internal Medicine | Admitting: Internal Medicine

## 2020-11-03 DIAGNOSIS — Z1231 Encounter for screening mammogram for malignant neoplasm of breast: Secondary | ICD-10-CM | POA: Diagnosis not present

## 2020-11-06 DIAGNOSIS — R590 Localized enlarged lymph nodes: Secondary | ICD-10-CM | POA: Diagnosis not present

## 2020-11-15 IMAGING — CT CT NECK W/ CM
5 of 6 series · 14 of 33 positions shown, 16 images · IV contrast (iopamidol)
Comparison: Head/neck ultrasound 04/05/2019.

CLINICAL DATA: Lump right side of neck under chin for 3 months.

EXAM:
CT NECK WITH CONTRAST
TECHNIQUE: Multidetector CT imaging of the neck was performed using the
standard protocol following the bolus administration of intravenous
contrast.
CONTRAST:  75mL [id number] IOPAMIDOL ([id number]) INJECTION 61%

[Series 2: neck 2.00 br40 s3 st/ no angle · axial · 0.49mm/px · z∈[-762,-688]mm · 2 of 112 slices shown, 3 images]
[im 38/112  soft-tissue]
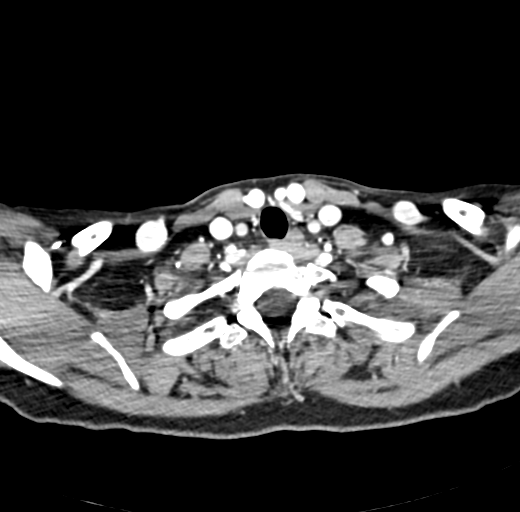
[im 38/112  bone]
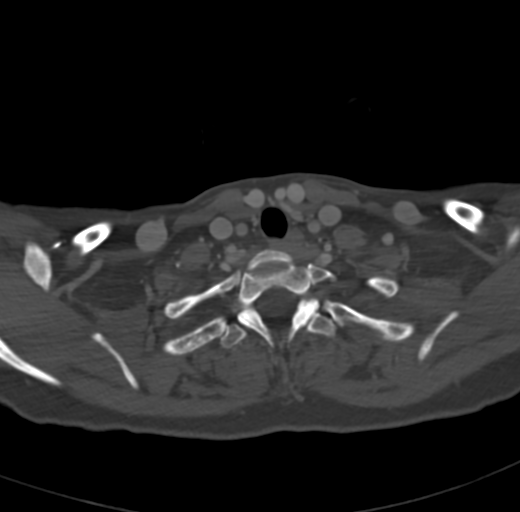
[im 75/112  bone]
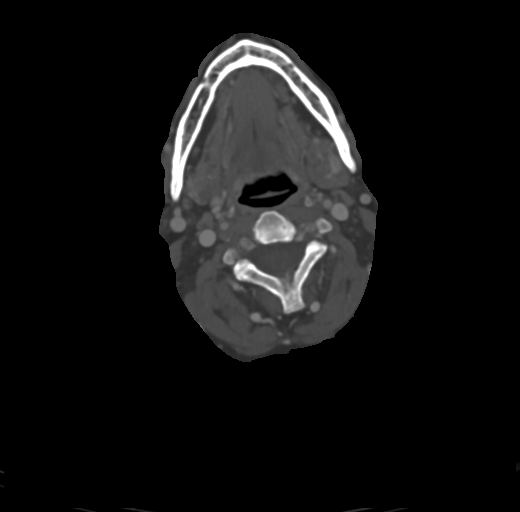

[Series 4: neck 2.00 br60 s3 bone/ no angle · axial · 0.49mm/px · z∈[-762,-688]mm · 2 of 112 slices shown]
[im 38/112  bone]
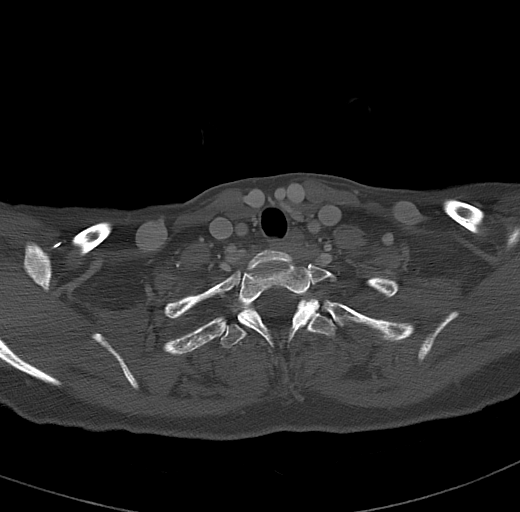
[im 75/112  bone]
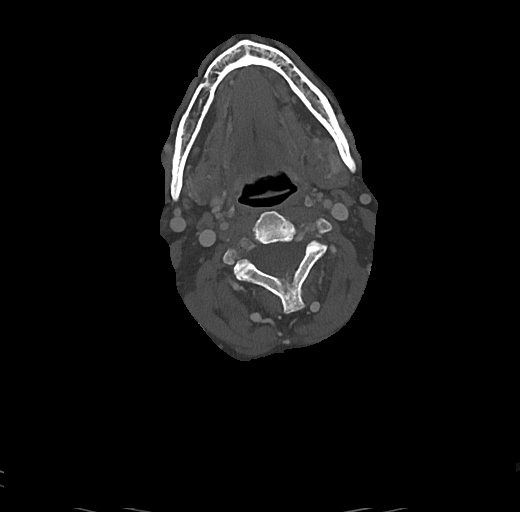

[Series 6: neck 2.00 br36 s3 angled axial (person_name) · axial · 0.49mm/px · z∈[-795,-717]mm · 2 of 124 slices shown]
[im 42/124  bone]
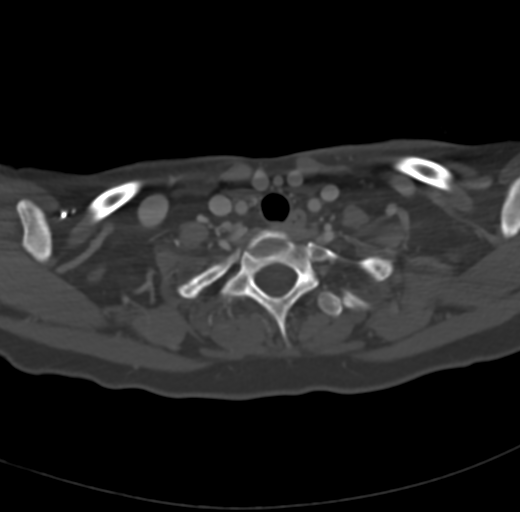
[im 83/124  bone]
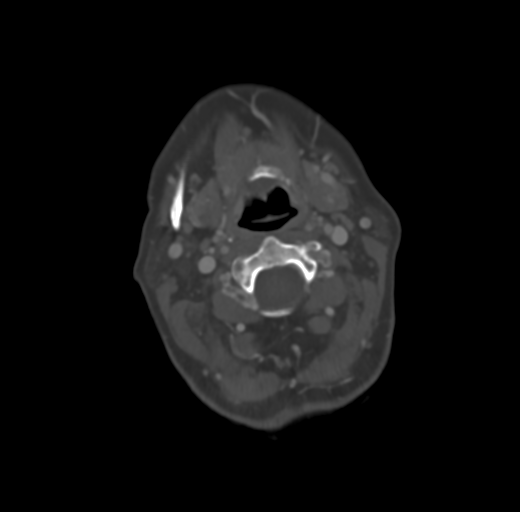

[Series 10: neck 2.00 br40 s3 (person_name) · coronal · 0.44mm/px · 3 of 127 slices shown (1 of 2)]
[im 26/127  bone]
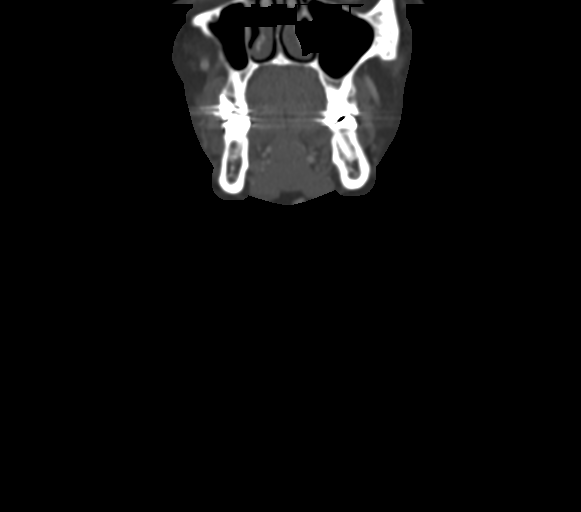
[im 51/127  bone]
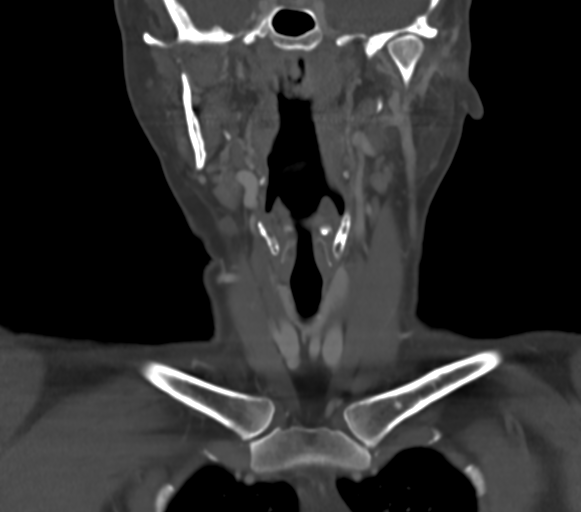
[im 76/127  bone]
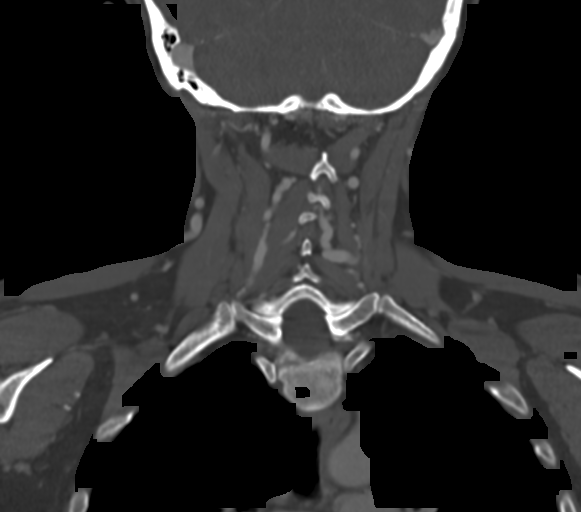

[Series 12: neck 2.00 br40 s3 (person_name) · sagittal · 0.44mm/px · 5 of 126 slices shown, 6 images (2 of 2)]
[im 42/126  bone]
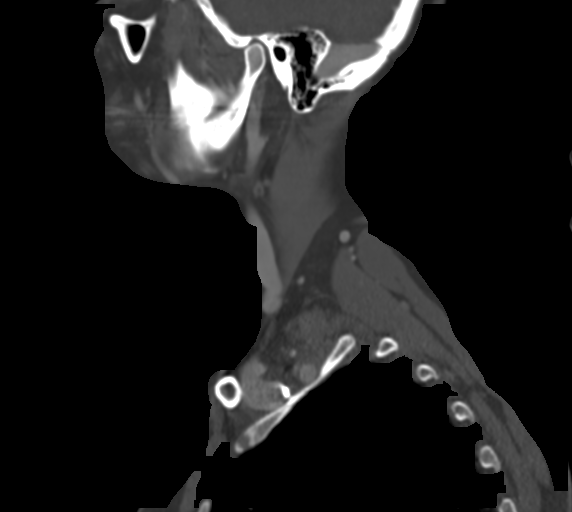
[im 53/126  bone]
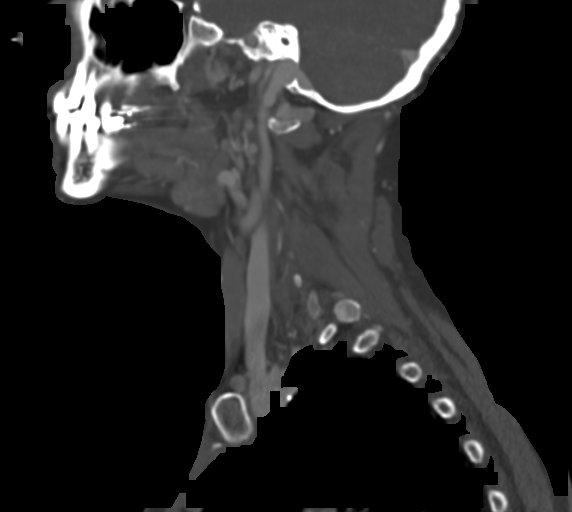
[im 63/126  soft-tissue]
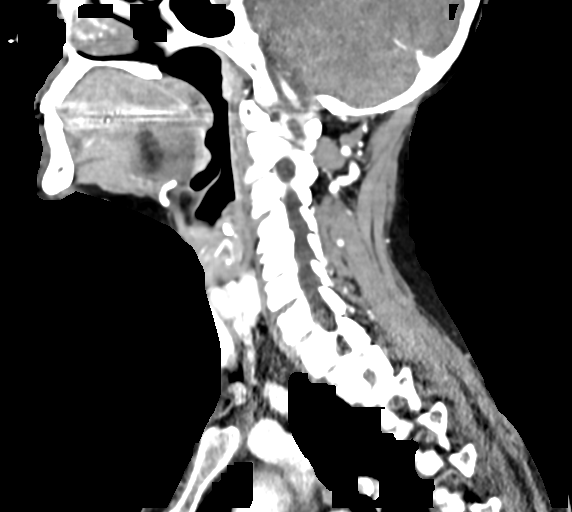
[im 63/126  bone]
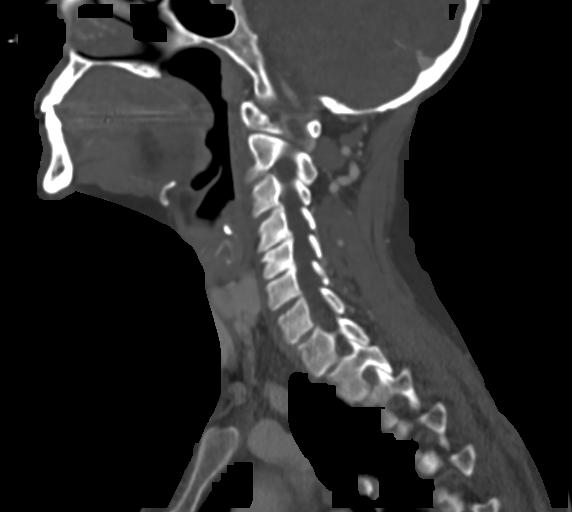
[im 73/126  bone]
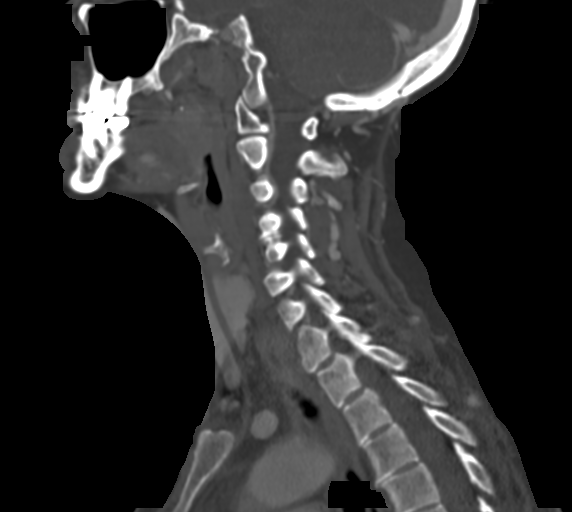
[im 84/126  bone]
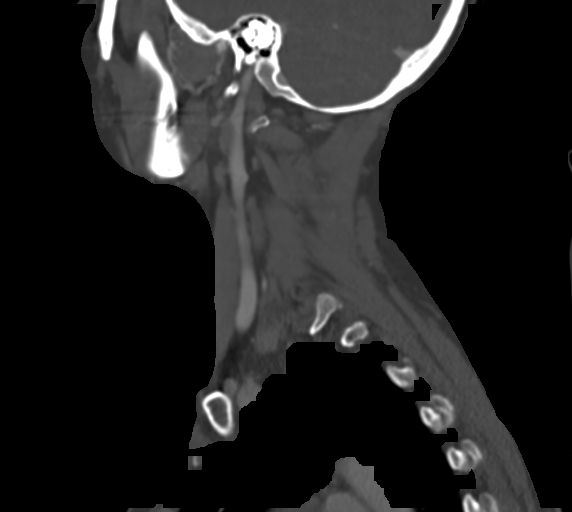

[14 of 33 positions shown; findings below may reference images not displayed]

FINDINGS: Pharynx and larynx: Streak artifact from dental restoration slightly
limits evaluation of the oral cavity. Within this limitation, no
swelling or discrete mass is appreciated within the oral cavity,
pharynx or larynx.

Salivary glands: No evidence of inflammation, mass or stone.

Thyroid: Negative

Lymph nodes: Immediately beneath the right neck skin surface marker,
there is a lobular and somewhat heterogeneous right level III lymph
node measuring 9 x 7 mm (series 2, images 840 [id number]). Otherwise no
enlarged or suspicious cervical lymph nodes are identified.

Vascular: The major vascular structures of the neck appear patent.

Limited intracranial: No abnormality identified.

Visualized orbits: Excluded from the field of view.

Mastoids and visualized paranasal sinuses: No significant paranasal
sinus disease or mastoid effusion at the imaged levels.

Skeleton: No acute bony abnormality. Mild cervical spondylosis
without significant bony spinal canal narrowing.

Upper chest: No consolidation within the imaged lung apices.
IMPRESSION: Indeterminate 9 x 7 mm lobular and somewhat heterogeneous right
level III lymph node immediately beneath the right neck skin surface
marker. Clinical follow-up recommended with repeat imaging if the
palpable abnormality does not resolve.

Otherwise, no enlarged or suspicious cervical chain lymph nodes. No
appreciable soft tissue neck mass.

## 2021-01-19 DIAGNOSIS — H43813 Vitreous degeneration, bilateral: Secondary | ICD-10-CM | POA: Diagnosis not present

## 2021-01-20 ENCOUNTER — Other Ambulatory Visit: Payer: Self-pay | Admitting: Adult Health

## 2021-01-20 DIAGNOSIS — K2100 Gastro-esophageal reflux disease with esophagitis, without bleeding: Secondary | ICD-10-CM

## 2021-01-20 DIAGNOSIS — E782 Mixed hyperlipidemia: Secondary | ICD-10-CM

## 2021-01-22 ENCOUNTER — Encounter: Payer: Self-pay | Admitting: Adult Health

## 2021-01-22 ENCOUNTER — Ambulatory Visit: Payer: BC Managed Care – PPO | Admitting: Adult Health

## 2021-01-22 ENCOUNTER — Other Ambulatory Visit: Payer: Self-pay

## 2021-01-22 VITALS — BP 108/62 | HR 69 | Temp 96.4°F | Ht 64.0 in | Wt 155.0 lb

## 2021-01-22 DIAGNOSIS — E559 Vitamin D deficiency, unspecified: Secondary | ICD-10-CM | POA: Diagnosis not present

## 2021-01-22 DIAGNOSIS — E782 Mixed hyperlipidemia: Secondary | ICD-10-CM

## 2021-01-22 DIAGNOSIS — E7841 Elevated Lipoprotein(a): Secondary | ICD-10-CM | POA: Diagnosis not present

## 2021-01-22 DIAGNOSIS — Z6826 Body mass index (BMI) 26.0-26.9, adult: Secondary | ICD-10-CM

## 2021-01-22 DIAGNOSIS — K219 Gastro-esophageal reflux disease without esophagitis: Secondary | ICD-10-CM | POA: Insufficient documentation

## 2021-01-22 DIAGNOSIS — R590 Localized enlarged lymph nodes: Secondary | ICD-10-CM

## 2021-01-22 DIAGNOSIS — Z79899 Other long term (current) drug therapy: Secondary | ICD-10-CM

## 2021-01-22 NOTE — Progress Notes (Signed)
FOLLOW UP 3 MONTH  Assessment and Plan:   Mixed hyperlipidemia Continue rosuvastatin 5mg  nightly decrease fatty foods increase activity.  -     Lipid panel -     CMP/GFR  Vitamin D deficiency Continue supplement for goal 60-100  -     VITAMIN D 25 Hydroxy (Vit-D Deficiency, Fractures)  Gastroesophageal reflux disease with esophagitis without hemorrhage Well managed on current medications Discussed diet, avoiding triggers and other lifestyle changes  Medication management Continued  Right anterior cervical adenopathy Appears stable x 1 year; Dr. Redmond Baseman has seen; planning to monitor unless changes per his note from 10/2020 Encouraged to monitor at home and contact office if noting enlargement or changes. She notes this waxes and wanes.   Discussed med's effects and SE's. Screening labs and tests as requested with regular follow-up as recommended. Over 30 minutes of face to face interview exam, counseling, chart review, and complex, high level critical decision making was performed this visit.   Future Appointments  Date Time Provider [place]  06/10/2021  3:00 PM Garnet Sierras, NP [id number] None     HPI  55 y.o. female  presents for 3 month follow up for  has Vitamin D deficiency; Medication management; Mixed hyperlipidemia; Cervical radiculitis; Occipital neuralgia of right side; and Elevated lipoprotein A level on their problem list..   She is following with ENT Dr. Redmond Baseman for right cervical mass/adenopathy.  She has had a CT soft tissue of neck 08/16/19. There is a 9x7 mm lobular heterogeneous right level lll lymph node.  Did follow up recently 11/06/2020, unchanged, plan to monitor.   BMI is Body mass index is 26.61 kg/m., she has been working on diet and exercise, makes good choices, avoids sugar, lots of water, walks 30-60 min daily.  Wt Readings from Last 3 Encounters:  01/22/21 155 lb (70.3 kg)  10/02/20 157 lb 3.2 oz (71.3 kg)  07/29/20 155 lb (70.3 kg)    Her blood pressure has been controlled at home, today their BP is BP: 108/62 She does workout. She denies chest pain, shortness of breath, dizziness.   She is on cholesterol medication rosuvastatin 5 mg after cardio IQ showing apoB 116 in 05/2018. Her cholesterol is at goal. History of dad with MI in 73's, PGM bypass in her 3's, both smokers. She has never smoked. The cholesterol last visit was:   Lab Results  Component Value Date   CHOL 169 10/02/2020   HDL 60 10/02/2020   LDLCALC 90 10/02/2020   TRIG 99 10/02/2020   CHOLHDL 2.8 10/02/2020    Last A1C in the office was:  Lab Results  Component Value Date   HGBA1C 4.9 06/10/2020   Patient is on Vitamin D supplement.   Lab Results  Component Value Date   VD25OH 67 06/07/2019       Current Medications:  Current Outpatient Medications on File Prior to Visit  Medication Sig Dispense Refill   famotidine (PEPCID) 40 MG tablet TAKE 1 TABLET DAILY FOR INDIGESTION, HEARTBURN, AND REFLUX 90 tablet 3   rosuvastatin (CRESTOR) 5 MG tablet TAKE 1 TABLET DAILY FOR CHOLESTEROL 90 tablet 3   No current facility-administered medications on file prior to visit.   Allergies:  No Known Allergies  Medical History:  She has Vitamin D deficiency; Medication management; Mixed hyperlipidemia; Cervical radiculitis; Occipital neuralgia of right side; and Elevated lipoprotein A level on their problem list.   Surgical History:  She has a past surgical history that includes Cesarean section (2002).  Family History:  Herfamily history includes Cancer in her maternal grandmother and mother; Colon polyps in her father; Diabetes in her father and paternal grandfather; Heart disease in her father and maternal grandfather; Hyperlipidemia in her mother; Hypertension in her father and maternal grandfather. Social History:  She reports that she quit smoking about 37 years ago. Her smoking use included cigarettes. She smoked an average of 0.25 packs per day.  She has never used smokeless tobacco. She reports current alcohol use of about 1.0 standard drink of alcohol per week. She reports that she does not use drugs.  Review of Systems: Review of Systems  Constitutional: Negative.  Negative for malaise/fatigue and weight loss.  HENT: Negative.  Negative for hearing loss and tinnitus.   Eyes: Negative.  Negative for blurred vision and double vision.  Respiratory: Negative.  Negative for cough, shortness of breath and wheezing.   Cardiovascular: Negative.  Negative for chest pain, palpitations, orthopnea, claudication and leg swelling.  Gastrointestinal: Negative.  Negative for abdominal pain, blood in stool, constipation, diarrhea, heartburn, melena, nausea and vomiting.  Genitourinary: Negative.   Musculoskeletal: Negative.  Negative for joint pain and myalgias.  Skin: Negative.  Negative for rash.  Neurological:  Negative for dizziness, tingling, sensory change, weakness and headaches.  Endo/Heme/Allergies:  Negative for polydipsia.  Psychiatric/Behavioral: Negative.    All other systems reviewed and are negative.  Physical Exam: Estimated body mass index is 26.61 kg/m as calculated from the following:   Height as of this encounter: 5\' 4"  (1.626 m).   Weight as of this encounter: 155 lb (70.3 kg). BP 108/62   Pulse 69   Temp (!) 96.4 F (35.8 C)   Ht 5\' 4"  (1.626 m)   Wt 155 lb (70.3 kg)   SpO2 97%   BMI 26.61 kg/m   General Appearance: Well nourished, in no apparent distress.  Eyes: PERRLA, EOMs, conjunctiva no swelling or erythema ENT/Mouth: Ext aud canals clear, normal light reflex with TMs without erythema, bulging. Good dentition. No erythema, swelling, or exudate on post pharynx. Tonsils not swollen or erythematous. Hearing normal.  Neck: Supple, thyroid normal. No bruits  Right side marble size cervical lymphandepathy nodes, non-tender. Respiratory: Respiratory effort normal, BS equal bilaterally without rales, rhonchi,  wheezing or stridor.  Cardio: RRR without murmurs, rubs or gallops. Brisk peripheral pulses without edema.  Chest: symmetric, with normal excursions and percussion.  Abdomen: Soft, nontender, no guarding, rebound, hernias, masses, or organomegaly.  Lymphatics: Right anterior chain cervical lymph node, marble size, non-tender. Otherwise no palpable lymphadenopathy.  Musculoskeletal: Full ROM all peripheral extremities,5/5 strength, and normal gait.  Skin: Warm, dry without rashes, lesions, ecchymosis. Neuro: Cranial nerves intact, reflexes equal bilaterally. Normal muscle tone, no cerebellar symptoms. Sensation intact.  Psych: Awake and oriented X 3, normal affect, Insight and Judgment appropriate.   Izora Ribas, NP 12:55 PM [facility]

## 2021-01-23 LAB — LIPID PANEL
Cholesterol: 159 mg/dL (ref <200)
HDL: 58 mg/dL (ref >=50)
LDL Cholesterol (Calc): 77 mg/dL (calc)
Non-HDL Cholesterol (Calc): 101 mg/dL (calc) (ref <130)
Total CHOL/HDL Ratio: 2.7 (calc) (ref <5.0)
Triglycerides: 139 mg/dL (ref <150)

## 2021-01-23 LAB — COMPLETE METABOLIC PANEL WITH GFR
AG Ratio: 1.7 (calc) (ref 1.0–2.5)
ALT: 14 U/L (ref 6–29)
AST: 16 U/L (ref 10–35)
Albumin: 4.4 g/dL (ref 3.6–5.1)
Alkaline phosphatase (APISO): 72 U/L (ref 37–153)
BUN: 11 mg/dL (ref 7–25)
CO2: 31 mmol/L (ref 20–32)
Calcium: 10.4 mg/dL (ref 8.6–10.4)
Chloride: 104 mmol/L (ref 98–110)
Creat: 0.68 mg/dL (ref 0.50–1.05)
GFR, Est African American: 114 mL/min/{1.73_m2} (ref >=60)
GFR, Est Non African American: 98 mL/min/{1.73_m2} (ref >=60)
Globulin: 2.6 g/dL (calc) (ref 1.9–3.7)
Glucose, Bld: 75 mg/dL (ref 65–99)
Potassium: 4 mmol/L (ref 3.5–5.3)
Sodium: 141 mmol/L (ref 135–146)
Total Bilirubin: 0.6 mg/dL (ref 0.2–1.2)
Total Protein: 7 g/dL (ref 6.1–8.1)

## 2021-01-23 LAB — VITAMIN D 25 HYDROXY (VIT D DEFICIENCY, FRACTURES): Vit D, 25-Hydroxy: 80 ng/mL (ref 30–100)

## 2021-02-11 DIAGNOSIS — H43813 Vitreous degeneration, bilateral: Secondary | ICD-10-CM | POA: Diagnosis not present

## 2021-05-06 DIAGNOSIS — H43813 Vitreous degeneration, bilateral: Secondary | ICD-10-CM | POA: Diagnosis not present

## 2021-06-09 DIAGNOSIS — E538 Deficiency of other specified B group vitamins: Secondary | ICD-10-CM | POA: Insufficient documentation

## 2021-06-09 NOTE — Progress Notes (Signed)
COMPLETE PHYSICAL  Assessment and Plan:  Encounter for Annual Physical Exam with abnormal findings Due annually  Health Maintenance reviewed Healthy lifestyle reviewed and goals set  Mixed hyperlipidemia Continue rosuvastatin 5mg  nightly decrease fatty foods increase activity.  -     Lipid panel -     CMP/GFR  Vitamin D deficiency Continue supplement for goal 60-100  -     VITAMIN D 25 Hydroxy (Vit-D Deficiency, Fractures)  Gastroesophageal reflux disease with esophagitis without hemorrhage Well managed on current medications Discussed diet, avoiding triggers and other lifestyle changes  B12 def Defer check per patient preference Will get on supplement and recheck next OV  Right anterior cervical adenopathy Waxing and waining; continue monthly self check Dr. Redmond Baseman has seen; planning to monitor unless changes per his note from 10/2020 Encouraged to monitor at home and contact office if noting persistent enlargement or changes.   Screening for cervical cancer -     Pap w/Age Based Scrn w/CT/NG/TRICH  Influenza vaccine needed -     Flu Vaccine QUAD 6+ mos PF IM (Fluarix Quad PF)  Orders Placed This Encounter  Procedures   Flu Vaccine QUAD 6+ mos PF IM (Fluarix Quad PF)   CBC with Differential/Platelet   COMPLETE METABOLIC PANEL WITH GFR   Magnesium   Lipid panel   TSH   VITAMIN D 25 Hydroxy (Vit-D Deficiency, Fractures)   Microalbumin / creatinine urine ratio   Urinalysis, Routine w reflex microscopic    Discussed med's effects and SE's. Screening labs and tests as requested with regular follow-up as recommended. Over 40 minutes of face to face interview exam, counseling, chart review, and complex, high level critical decision making was performed this visit.   Future Appointments  Date Time Provider [place]  12/09/2021  9:30 AM Ashley Comber, NP [facility] None  06/10/2022  9:00 AM Ashley Comber, NP [facility] None     HPI  55 y.o. female   presents for a complete physical and follow up for has Vitamin D deficiency; Medication management; Mixed hyperlipidemia; Elevated lipoprotein A level; Acid reflux; Anterior cervical adenopathy; and B12 deficiency on their problem list.  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 She has long term partner she lives with. She has stress, teenage daughter, Jerene Pitch. Reports overall she is doing well.    She is following with ENT Dr. Redmond Baseman for right cervical mass/adenopathy.  She has had a CT soft tissue of neck 08/16/19. There is a 9x7 mm lobular heterogeneous right level lll lymph node.  Did follow up recently 11/06/2020, unchanged, plan to monitor.   BMI is Body mass index is 27.09 kg/m., she has been working on diet and exercise, makes good choices, avoids sugar, lots of water, walks 30-60 min daily.  Wt Readings from Last 3 Encounters:  06/10/21 157 lb 12.8 oz (71.6 kg)  01/22/21 155 lb (70.3 kg)  10/02/20 157 lb 3.2 oz (71.3 kg)   Her blood pressure has been controlled at home, today their BP is BP: 112/80 She does workout. She denies chest pain, shortness of breath, dizziness.    She is on cholesterol medication rosuvastatin 5 mg after cardio IQ showing apoB 116 in 05/2018. Her cholesterol is at goal. History of dad with MI in 37's, PGM bypass in her 37's, both smokers. She has never smoked. The cholesterol last visit was:   Lab Results  Component Value Date   CHOL 159 01/22/2021   HDL 58 01/22/2021   LDLCALC 77 01/22/2021  TRIG 139 01/22/2021   CHOLHDL 2.7 01/22/2021    Last A1C in the office was:  Lab Results  Component Value Date   HGBA1C 4.9 06/10/2020   Patient is on Vitamin D supplement.   Lab Results  Component Value Date   VD25OH 74 01/22/2021     She is not on supplement - wililng to start  Lab Results  Component Value Date   [id number] 882 06/10/2020      Current Medications:  Current Outpatient Medications on File Prior to Visit  Medication Sig Dispense Refill    CHOLECALCIFEROL PO Take 5,000 Units by mouth daily.     famotidine (PEPCID) 40 MG tablet TAKE 1 TABLET DAILY FOR INDIGESTION, HEARTBURN, AND REFLUX 90 tablet 3   rosuvastatin (CRESTOR) 5 MG tablet TAKE 1 TABLET DAILY FOR CHOLESTEROL 90 tablet 3   No current facility-administered medications on file prior to visit.   Allergies:  No Known Allergies  Medical History:  She has Vitamin D deficiency; Medication management; Mixed hyperlipidemia; Elevated lipoprotein A level; Acid reflux; Anterior cervical adenopathy; and B12 deficiency on their problem list.  Health Maintenance:   Immunization History  Administered Date(s) Administered   Influenza Inj Mdck Quad With Preservative 06/07/2019, 06/10/2020   Influenza Split 04/15/2011   Influenza,inj,Quad PF,6+ Mos 06/10/2021   PFIZER(Purple Top)SARS-COV-2 Vaccination 11/09/2019, 12/03/2019   Tdap 04/15/2011, 03/09/2016   Tetanus: 2017 Pneumovax:At age 79 Flu vaccine: 06/2020 TODAY  Zostavax: Shingrix discussed with patient Covid 19: 2/2, pfizer  No LMP recorded. Patient is postmenopausal. Pap: 2015 neg HPV, DUE TODAY  MGM: 10/2020 DEXA: due age 27  Colonoscopy: 09/2015 10 years  Last eye: 2022,  Last dental: 2022, q52m Last derm: PRN, nonre cent   Patient Care Team: Unk Pinto, MD as PCP - General (Internal Medicine) Pcp, No  Surgical History:  She has a past surgical history that includes Cesarean section (08/09/2000) and Cataract extraction (Bilateral, 2019). Family History:  Herfamily history includes Breast cancer (age of onset: 61) in her cousin; Cancer in her maternal grandmother and mother; Colon polyps in her father; Diabetes in her father and paternal grandfather; Heart disease in her father and maternal grandfather; Hyperlipidemia in her mother; Hypertension in her father and maternal grandfather. Social History:  She reports that she quit smoking about 37 years ago. Her smoking use included cigarettes. She has a  0.75 pack-year smoking history. She has never used smokeless tobacco. She reports current alcohol use of about 1.0 standard drink per week. She reports that she does not use drugs.  Review of Systems: Review of Systems  Constitutional: Negative.  Negative for malaise/fatigue and weight loss.  HENT: Negative.  Negative for hearing loss and tinnitus.   Eyes: Negative.  Negative for blurred vision and double vision.  Respiratory: Negative.  Negative for cough, sputum production, shortness of breath and wheezing.   Cardiovascular: Negative.  Negative for chest pain, palpitations, orthopnea, claudication, leg swelling and PND.  Gastrointestinal: Negative.  Negative for abdominal pain, blood in stool, constipation, diarrhea, heartburn, melena, nausea and vomiting.  Genitourinary: Negative.   Musculoskeletal: Negative.  Negative for falls, joint pain and myalgias.  Skin: Negative.  Negative for rash.  Neurological:  Negative for dizziness, tingling, sensory change, weakness and headaches.  Endo/Heme/Allergies:  Negative for polydipsia.  Psychiatric/Behavioral: Negative.  Negative for depression, memory loss, substance abuse and suicidal ideas. The patient is not nervous/anxious and does not have insomnia.   All other systems reviewed and are negative.  Physical Exam: Estimated  body mass index is 27.09 kg/m as calculated from the following:   Height as of this encounter: 5\' 4"  (1.626 m).   Weight as of this encounter: 157 lb 12.8 oz (71.6 kg). BP 112/80   Pulse 78   Temp (!) 97.5 F (36.4 C)   Ht 5\' 4"  (1.626 m)   Wt 157 lb 12.8 oz (71.6 kg)   SpO2 99%   BMI 27.09 kg/m  General Appearance: Well nourished, in no apparent distress.  Eyes: PERRLA, EOMs, conjunctiva no swelling or erythema, normal fundi and vessels.  Sinuses: No Frontal/maxillary tenderness  ENT/Mouth: Ext aud canals clear, normal light reflex with TMs without erythema, bulging. Good dentition. No erythema, swelling, or  exudate on post pharynx. Tonsils not swollen or erythematous. Hearing normal.  Neck: Supple, thyroid normal. No bruits  Respiratory: Respiratory effort normal, BS equal bilaterally without rales, rhonchi, wheezing or stridor.  Cardio: RRR without murmurs, rubs or gallops. Brisk peripheral pulses without edema.  Chest: symmetric, with normal excursions and percussion.  Breasts: getting mammograms, defer today, no concerns Abdomen: Soft, nontender, no guarding, rebound, hernias, masses, or organomegaly.  Lymphatics: Non tender. Left cervical lymph node, marble size, similar previous documented. Musculoskeletal: Full ROM all peripheral extremities,5/5 strength, and normal gait.  Skin: lipoma right anterior medial thigh. Warm, dry without rashes, lesions, ecchymosis. Neuro: Cranial nerves intact, reflexes equal bilaterally. Normal muscle tone, no cerebellar symptoms. Sensation intact.  Psych: Awake and oriented X 3, normal affect, Insight and Judgment appropriate.  Female genitalia: normal external genitalia, vulva, vagina, cervix, uterus and adnexa Chaperoned by Chancy Hurter, CMA   EKG: defer this year  Ashley Ribas, NP 12:26 PM [facility]

## 2021-06-10 ENCOUNTER — Ambulatory Visit: Payer: BC Managed Care – PPO | Admitting: Adult Health

## 2021-06-10 ENCOUNTER — Encounter: Payer: BC Managed Care – PPO | Admitting: Adult Health Nurse Practitioner

## 2021-06-10 ENCOUNTER — Encounter: Payer: Self-pay | Admitting: Adult Health

## 2021-06-10 ENCOUNTER — Other Ambulatory Visit: Payer: Self-pay

## 2021-06-10 VITALS — BP 112/80 | HR 78 | Temp 97.5°F | Ht 64.0 in | Wt 157.8 lb

## 2021-06-10 DIAGNOSIS — Z1389 Encounter for screening for other disorder: Secondary | ICD-10-CM | POA: Diagnosis not present

## 2021-06-10 DIAGNOSIS — E7841 Elevated Lipoprotein(a): Secondary | ICD-10-CM

## 2021-06-10 DIAGNOSIS — Z0000 Encounter for general adult medical examination without abnormal findings: Secondary | ICD-10-CM

## 2021-06-10 DIAGNOSIS — Z13220 Encounter for screening for lipoid disorders: Secondary | ICD-10-CM

## 2021-06-10 DIAGNOSIS — E559 Vitamin D deficiency, unspecified: Secondary | ICD-10-CM | POA: Diagnosis not present

## 2021-06-10 DIAGNOSIS — Z79899 Other long term (current) drug therapy: Secondary | ICD-10-CM | POA: Diagnosis not present

## 2021-06-10 DIAGNOSIS — Z124 Encounter for screening for malignant neoplasm of cervix: Secondary | ICD-10-CM | POA: Diagnosis not present

## 2021-06-10 DIAGNOSIS — Z23 Encounter for immunization: Secondary | ICD-10-CM | POA: Diagnosis not present

## 2021-06-10 DIAGNOSIS — Z0001 Encounter for general adult medical examination with abnormal findings: Secondary | ICD-10-CM

## 2021-06-10 DIAGNOSIS — K219 Gastro-esophageal reflux disease without esophagitis: Secondary | ICD-10-CM

## 2021-06-10 DIAGNOSIS — E782 Mixed hyperlipidemia: Secondary | ICD-10-CM

## 2021-06-10 DIAGNOSIS — E538 Deficiency of other specified B group vitamins: Secondary | ICD-10-CM

## 2021-06-10 DIAGNOSIS — Z8249 Family history of ischemic heart disease and other diseases of the circulatory system: Secondary | ICD-10-CM

## 2021-06-10 DIAGNOSIS — Z136 Encounter for screening for cardiovascular disorders: Secondary | ICD-10-CM

## 2021-06-10 DIAGNOSIS — R590 Localized enlarged lymph nodes: Secondary | ICD-10-CM

## 2021-06-10 NOTE — Patient Instructions (Addendum)
  Ashley Carpenter , Thank you for taking time to come for your Annual Wellness Visit. I appreciate your ongoing commitment to your health goals. Please review the following plan we discussed and let me know if I can assist you in the future.   These are the goals we discussed:  Goals      Exercise 150 min/wk Moderate Activity     Focus on resistance exercises        This is a list of the screening recommended for you and due dates:  Health Maintenance  Topic Date Due   Zoster (Shingles) Vaccine (1 of 2) Never done   Pap Smear  03/09/2018   Flu Shot  03/09/2021   COVID-19 Vaccine (3 - Pfizer risk series) 06/26/2021*   Pneumococcal Vaccination (1 - PCV) 06/11/2031*   Mammogram  11/04/2022   Colon Cancer Screening  09/25/2025   Tetanus Vaccine  03/09/2026   HPV Vaccine  Aged Out   Hepatitis C Screening: USPSTF Recommendation to screen - Ages 18-79 yo.  Discontinued   HIV Screening  Discontinued  *Topic was postponed. The date shown is not the original due date.      Start b12 sublingual - 1000 mcg once weekly, or 250-500 mcg daily       Know what a healthy weight is for you (roughly BMI <25) and aim to maintain this  Aim for 7+ servings of fruits and vegetables daily  65-80+ fluid ounces of water or unsweet tea for healthy kidneys  Limit to max 1 drink of alcohol per day; avoid smoking/tobacco  Limit animal fats in diet for cholesterol and heart health - choose grass fed whenever available  Avoid highly processed foods, and foods high in saturated/trans fats  Aim for low stress - take time to unwind and care for your mental health  Aim for 150 min of moderate intensity exercise weekly for heart health, and weights twice weekly for bone health  Aim for 7-9 hours of sleep daily

## 2021-06-11 ENCOUNTER — Other Ambulatory Visit: Payer: Self-pay | Admitting: Adult Health

## 2021-06-11 DIAGNOSIS — D709 Neutropenia, unspecified: Secondary | ICD-10-CM

## 2021-06-11 LAB — VITAMIN D 25 HYDROXY (VIT D DEFICIENCY, FRACTURES): Vit D, 25-Hydroxy: 91 ng/mL (ref 30–100)

## 2021-06-11 LAB — COMPLETE METABOLIC PANEL WITH GFR
AG Ratio: 1.6 (calc) (ref 1.0–2.5)
ALT: 16 U/L (ref 6–29)
AST: 18 U/L (ref 10–35)
Albumin: 4.4 g/dL (ref 3.6–5.1)
Alkaline phosphatase (APISO): 69 U/L (ref 37–153)
BUN/Creatinine Ratio: 8 (calc) (ref 6–22)
BUN: 6 mg/dL — ABNORMAL LOW (ref 7–25)
CO2: 27 mmol/L (ref 20–32)
Calcium: 10.6 mg/dL — ABNORMAL HIGH (ref 8.6–10.4)
Chloride: 105 mmol/L (ref 98–110)
Creat: 0.71 mg/dL (ref 0.50–1.03)
Globulin: 2.7 g/dL (calc) (ref 1.9–3.7)
Glucose, Bld: 94 mg/dL (ref 65–99)
Potassium: 4.2 mmol/L (ref 3.5–5.3)
Sodium: 140 mmol/L (ref 135–146)
Total Bilirubin: 0.7 mg/dL (ref 0.2–1.2)
Total Protein: 7.1 g/dL (ref 6.1–8.1)
eGFR: 100 mL/min/{1.73_m2} (ref >=60)

## 2021-06-11 LAB — MICROALBUMIN / CREATININE URINE RATIO
Creatinine, Urine: 33 mg/dL (ref 20–275)
Microalb, Ur: <0.2 mg/dL

## 2021-06-11 LAB — CBC WITH DIFFERENTIAL/PLATELET
Absolute Monocytes: 353 cells/uL (ref 200–950)
Basophils Absolute: 40 cells/uL (ref 0–200)
Basophils Relative: 1.2 %
Eosinophils Absolute: 79 cells/uL (ref 15–500)
Eosinophils Relative: 2.4 %
HCT: 40.2 % (ref 35.0–45.0)
Hemoglobin: 13.4 g/dL (ref 11.7–15.5)
Lymphs Abs: 1353 cells/uL (ref 850–3900)
MCH: 31.8 pg (ref 27.0–33.0)
MCHC: 33.3 g/dL (ref 32.0–36.0)
MCV: 95.3 fL (ref 80.0–100.0)
MPV: 10 fL (ref 7.5–12.5)
Monocytes Relative: 10.7 %
Neutro Abs: 1475 cells/uL — ABNORMAL LOW (ref 1500–7800)
Neutrophils Relative %: 44.7 %
Platelets: 270 10*3/uL (ref 140–400)
RBC: 4.22 10*6/uL (ref 3.80–5.10)
RDW: 11.7 % (ref 11.0–15.0)
Total Lymphocyte: 41 %
WBC: 3.3 10*3/uL — ABNORMAL LOW (ref 3.8–10.8)

## 2021-06-11 LAB — URINALYSIS, ROUTINE W REFLEX MICROSCOPIC
Bilirubin Urine: NEGATIVE
Glucose, UA: NEGATIVE
Hgb urine dipstick: NEGATIVE
Ketones, ur: NEGATIVE
Leukocytes,Ua: NEGATIVE
Nitrite: NEGATIVE
Protein, ur: NEGATIVE
Specific Gravity, Urine: 1.006 (ref 1.001–1.035)
pH: 5.5 (ref 5.0–8.0)

## 2021-06-11 LAB — MAGNESIUM: Magnesium: 2 mg/dL (ref 1.5–2.5)

## 2021-06-11 LAB — LIPID PANEL
Cholesterol: 144 mg/dL (ref <200)
HDL: 63 mg/dL (ref >=50)
LDL Cholesterol (Calc): 64 mg/dL (calc)
Non-HDL Cholesterol (Calc): 81 mg/dL (calc) (ref <130)
Total CHOL/HDL Ratio: 2.3 (calc) (ref <5.0)
Triglycerides: 87 mg/dL (ref <150)

## 2021-06-11 LAB — TSH: TSH: 2.08 mIU/L

## 2021-06-15 LAB — PAP, TP IMAGING W/ HPV RNA, RFLX HPV TYPE 16,18/45: HPV DNA High Risk: NOT DETECTED

## 2021-06-15 LAB — C. TRACHOMATIS/N. GONORRHOEAE RNA
C. trachomatis RNA, TMA: NOT DETECTED
N. gonorrhoeae RNA, TMA: NOT DETECTED

## 2021-06-15 LAB — T. VAGINALIS RNA, QL TMA, PAP VIAL: T. vaginalis RNA, QL TMA: NOT DETECTED

## 2021-06-15 LAB — PAP W/AGE BASED SCRN W/CT/NG/TRICH

## 2021-07-23 ENCOUNTER — Other Ambulatory Visit: Payer: Self-pay

## 2021-07-23 ENCOUNTER — Other Ambulatory Visit: Payer: BC Managed Care – PPO

## 2021-07-23 DIAGNOSIS — D709 Neutropenia, unspecified: Secondary | ICD-10-CM | POA: Diagnosis not present

## 2021-07-24 ENCOUNTER — Other Ambulatory Visit: Payer: Self-pay | Admitting: Adult Health

## 2021-07-24 LAB — CBC WITH DIFFERENTIAL/PLATELET
Absolute Monocytes: 530 cells/uL (ref 200–950)
Basophils Absolute: 47 cells/uL (ref 0–200)
Basophils Relative: 0.5 %
Eosinophils Absolute: 74 cells/uL (ref 15–500)
Eosinophils Relative: 0.8 %
HCT: 41.8 % (ref 35.0–45.0)
Hemoglobin: 14.1 g/dL (ref 11.7–15.5)
Lymphs Abs: 1153 cells/uL (ref 850–3900)
MCH: 32 pg (ref 27.0–33.0)
MCHC: 33.7 g/dL (ref 32.0–36.0)
MCV: 94.8 fL (ref 80.0–100.0)
MPV: 9.7 fL (ref 7.5–12.5)
Monocytes Relative: 5.7 %
Neutro Abs: 7496 cells/uL (ref 1500–7800)
Neutrophils Relative %: 80.6 %
Platelets: 307 10*3/uL (ref 140–400)
RBC: 4.41 10*6/uL (ref 3.80–5.10)
RDW: 11.8 % (ref 11.0–15.0)
Total Lymphocyte: 12.4 %
WBC: 9.3 10*3/uL (ref 3.8–10.8)

## 2021-07-24 LAB — BASIC METABOLIC PANEL WITH GFR
BUN: 9 mg/dL (ref 7–25)
CO2: 30 mmol/L (ref 20–32)
Calcium: 10.8 mg/dL — ABNORMAL HIGH (ref 8.6–10.4)
Chloride: 102 mmol/L (ref 98–110)
Creat: 0.79 mg/dL (ref 0.50–1.03)
Glucose, Bld: 84 mg/dL (ref 65–99)
Potassium: 4.2 mmol/L (ref 3.5–5.3)
Sodium: 138 mmol/L (ref 135–146)
eGFR: 88 mL/min/{1.73_m2} (ref >=60)

## 2021-08-09 DIAGNOSIS — E213 Hyperparathyroidism, unspecified: Secondary | ICD-10-CM

## 2021-08-09 HISTORY — DX: Hyperparathyroidism, unspecified: E21.3

## 2021-08-27 ENCOUNTER — Other Ambulatory Visit: Payer: Self-pay | Admitting: Internal Medicine

## 2021-08-27 DIAGNOSIS — Z1231 Encounter for screening mammogram for malignant neoplasm of breast: Secondary | ICD-10-CM

## 2021-09-14 NOTE — Progress Notes (Signed)
Assessment and Plan:  Ashley Carpenter was seen today for acute visit.  Diagnoses and all orders for this visit:  Dysuria -     Urine Culture -     Urinalysis, Routine w reflex microscopic -     nitrofurantoin, macrocrystal-monohydrate, (MACROBID) 100 MG capsule; Take 1 capsule (100 mg total) by mouth 2 (two) times daily for 7 days. -     phenazopyridine (PYRIDIUM) 200 MG tablet; Take 1 tablet (200 mg total) by mouth 3 (three) times daily as needed for pain. No more than 3 days Push fluids If develop fever, back pain, nausea or vomiting notify the office       Further disposition pending results of labs. Discussed med's effects and SE's.   Over 20 minutes of exam, counseling, chart review, and critical decision making was performed.   Future Appointments  Date Time Provider [place]  10/22/2021 11:30 AM [facility] None  11/04/2021 11:30 AM [facility] MM 2 [facility]  12/09/2021  9:30 AM Ashley Comber, NP [facility] None  06/10/2022  9:00 AM Ashley Comber, NP [facility] None    ------------------------------------------------------------------------------------------------------------------   HPI BP 102/62    Pulse 69    Temp 97.7 F (36.5 C)    Wt 152 lb 12.8 oz (69.3 kg)    SpO2 97%    BMI 26.23 kg/m   55 y.o.female presents for Burning on urination, urgency,and urinating small amounts at a time for approximately a week.  Then 5 days ago she began having lower back pain and abdominal pressure.  Denies fever, nausea, vomiting. She does take Replens which has usually resolved symptoms but did not currently improve  Past Medical History:  Diagnosis Date   Allergy    Cervical radiculitis 07/09/2017   Occipital neuralgia of right side 07/09/2017     No Known Allergies  Current Outpatient Medications on File Prior to Visit  Medication Sig   CHOLECALCIFEROL PO Take 5,000 Units by mouth daily. Taking every other day   famotidine (PEPCID) 40 MG tablet TAKE  1 TABLET DAILY FOR INDIGESTION, HEARTBURN, AND REFLUX   rosuvastatin (CRESTOR) 5 MG tablet TAKE 1 TABLET DAILY FOR CHOLESTEROL   Zinc 25 MG TABS Take by mouth.   No current facility-administered medications on file prior to visit.    ROS: all negative except above.   Physical Exam:  BP 102/62    Pulse 69    Temp 97.7 F (36.5 C)    Wt 152 lb 12.8 oz (69.3 kg)    SpO2 97%    BMI 26.23 kg/m   General Appearance: Well nourished, in no apparent distress. Eyes: PERRLA, EOMs, conjunctiva no swelling or erythema Sinuses: No Frontal/maxillary tenderness ENT/Mouth: Ext aud canals clear, TMs without erythema, bulging. No erythema, swelling, or exudate on post pharynx.  Tonsils not swollen or erythematous. Hearing normal.  Neck: Supple, thyroid normal.  Respiratory: Respiratory effort normal, BS equal bilaterally without rales, rhonchi, wheezing or stridor.  Cardio: RRR with no MRGs. Brisk peripheral pulses without edema.  Abdomen: Soft, + BS.  Non tender, no guarding, rebound, hernias, masses. Lymphatics: Non tender without lymphadenopathy.  Musculoskeletal: Full ROM, 5/5 strength, normal gait. Negative CVA tenderness Skin: Warm, dry without rashes, lesions, ecchymosis.  Neuro: Cranial nerves intact. Normal muscle tone, no cerebellar symptoms. Sensation intact.  Psych: Awake and oriented X 3, normal affect, Insight and Judgment appropriate.     Ashley Bernheim, NP 3:46 PM [facility]

## 2021-09-15 ENCOUNTER — Other Ambulatory Visit: Payer: Self-pay

## 2021-09-15 ENCOUNTER — Encounter: Payer: Self-pay | Admitting: Nurse Practitioner

## 2021-09-15 ENCOUNTER — Ambulatory Visit: Payer: BC Managed Care – PPO | Admitting: Nurse Practitioner

## 2021-09-15 VITALS — BP 102/62 | HR 69 | Temp 97.7°F | Wt 152.8 lb

## 2021-09-15 DIAGNOSIS — R300 Dysuria: Secondary | ICD-10-CM

## 2021-09-15 MED ORDER — PHENAZOPYRIDINE HCL 200 MG PO TABS: 200.0000 mg | ORAL_TABLET | Freq: Three times a day (TID) | ORAL | 0 refills | Status: AC | PRN

## 2021-09-15 MED ORDER — NITROFURANTOIN MONOHYD MACRO 100 MG PO CAPS: 100.0000 mg | ORAL_CAPSULE | Freq: Two times a day (BID) | ORAL | 0 refills | Status: AC

## 2021-09-17 LAB — URINALYSIS, ROUTINE W REFLEX MICROSCOPIC
Bilirubin Urine: NEGATIVE
Glucose, UA: NEGATIVE
Hyaline Cast: NONE SEEN /LPF
Ketones, ur: NEGATIVE
Nitrite: NEGATIVE
Protein, ur: NEGATIVE
RBC / HPF: NONE SEEN /HPF (ref 0–2)
Specific Gravity, Urine: 1.006 (ref 1.001–1.035)
Squamous Epithelial / HPF: NONE SEEN /HPF (ref <=5)
pH: 6.5 (ref 5.0–8.0)

## 2021-09-17 LAB — URINE CULTURE
MICRO NUMBER:: 12975572
SPECIMEN QUALITY:: ADEQUATE

## 2021-10-22 ENCOUNTER — Other Ambulatory Visit: Payer: Self-pay

## 2021-10-22 ENCOUNTER — Other Ambulatory Visit: Payer: BC Managed Care – PPO

## 2021-10-22 DIAGNOSIS — R590 Localized enlarged lymph nodes: Secondary | ICD-10-CM | POA: Diagnosis not present

## 2021-10-22 DIAGNOSIS — E039 Hypothyroidism, unspecified: Secondary | ICD-10-CM

## 2021-10-23 ENCOUNTER — Other Ambulatory Visit: Payer: Self-pay | Admitting: Adult Health

## 2021-10-23 DIAGNOSIS — E213 Hyperparathyroidism, unspecified: Secondary | ICD-10-CM

## 2021-10-23 LAB — PTH, INTACT AND CALCIUM
Calcium: 10.5 mg/dL — ABNORMAL HIGH (ref 8.6–10.4)
PTH: 85 pg/mL — ABNORMAL HIGH (ref 16–77)

## 2021-10-24 ENCOUNTER — Encounter: Payer: Self-pay | Admitting: Adult Health

## 2021-10-26 ENCOUNTER — Other Ambulatory Visit: Payer: Self-pay

## 2021-10-26 ENCOUNTER — Other Ambulatory Visit: Payer: BC Managed Care – PPO

## 2021-10-26 DIAGNOSIS — E213 Hyperparathyroidism, unspecified: Secondary | ICD-10-CM

## 2021-10-29 ENCOUNTER — Other Ambulatory Visit: Payer: Self-pay | Admitting: Adult Health

## 2021-10-29 DIAGNOSIS — R590 Localized enlarged lymph nodes: Secondary | ICD-10-CM

## 2021-10-29 DIAGNOSIS — E213 Hyperparathyroidism, unspecified: Secondary | ICD-10-CM

## 2021-11-02 DIAGNOSIS — E213 Hyperparathyroidism, unspecified: Secondary | ICD-10-CM | POA: Diagnosis not present

## 2021-11-03 LAB — CALCIUM, 24-HOUR URINE WITH CREATININE
CALCIUM/CREATININE RATIO: 133 mg/g creat (ref 30–275)
Calcium, 24H Urine: 109 mg/24 h
Creatinine, 24H Ur: 0.82 g/(24.h) (ref 0.50–2.15)

## 2021-11-04 ENCOUNTER — Ambulatory Visit
Admission: RE | Admit: 2021-11-04 | Discharge: 2021-11-04 | Disposition: A | Discharge status: Home / Self Care | Payer: BC Managed Care – PPO | Source: Ambulatory Visit | Attending: Internal Medicine | Admitting: Internal Medicine

## 2021-11-04 DIAGNOSIS — Z1231 Encounter for screening mammogram for malignant neoplasm of breast: Secondary | ICD-10-CM | POA: Diagnosis not present

## 2021-11-26 ENCOUNTER — Ambulatory Visit
Admission: RE | Admit: 2021-11-26 | Discharge: 2021-11-26 | Disposition: A | Discharge status: Home / Self Care | Payer: BC Managed Care – PPO | Source: Ambulatory Visit | Attending: Adult Health | Admitting: Adult Health

## 2021-11-26 DIAGNOSIS — R590 Localized enlarged lymph nodes: Secondary | ICD-10-CM | POA: Diagnosis not present

## 2021-11-26 DIAGNOSIS — E210 Primary hyperparathyroidism: Secondary | ICD-10-CM | POA: Diagnosis not present

## 2021-11-26 DIAGNOSIS — E213 Hyperparathyroidism, unspecified: Secondary | ICD-10-CM

## 2021-11-26 DIAGNOSIS — M47812 Spondylosis without myelopathy or radiculopathy, cervical region: Secondary | ICD-10-CM | POA: Diagnosis not present

## 2021-11-26 MED ORDER — IOPAMIDOL (ISOVUE-370) INJECTION 76%
75.0000 mL | Freq: Once | INTRAVENOUS | Status: AC | PRN
  Administered 2021-11-26: 75 mL via INTRAVENOUS

## 2021-12-01 ENCOUNTER — Telehealth: Payer: Self-pay | Admitting: Adult Health

## 2021-12-01 DIAGNOSIS — R590 Localized enlarged lymph nodes: Secondary | ICD-10-CM | POA: Diagnosis not present

## 2021-12-01 DIAGNOSIS — E213 Hyperparathyroidism, unspecified: Secondary | ICD-10-CM | POA: Diagnosis not present

## 2021-12-01 NOTE — Telephone Encounter (Signed)
ENT office [facility] was referred too called, Dr. Melida Quitter who saw her yesterday is needing to speak to you as soon as possible. She didn't elaborate as to what it was about, but she said the sooner the better. The office number is [phone], and she said she would transfer you once you called.  ?

## 2021-12-09 ENCOUNTER — Ambulatory Visit: Payer: BC Managed Care – PPO | Admitting: Adult Health

## 2021-12-11 ENCOUNTER — Ambulatory Visit: Payer: BC Managed Care – PPO | Admitting: Nurse Practitioner

## 2021-12-14 ENCOUNTER — Encounter: Payer: Self-pay | Admitting: Nurse Practitioner

## 2021-12-14 ENCOUNTER — Ambulatory Visit (INDEPENDENT_AMBULATORY_CARE_PROVIDER_SITE_OTHER): Payer: BC Managed Care – PPO | Admitting: Nurse Practitioner

## 2021-12-14 VITALS — BP 100/60 | HR 63 | Temp 97.5°F | Wt 151.0 lb

## 2021-12-14 DIAGNOSIS — E782 Mixed hyperlipidemia: Secondary | ICD-10-CM

## 2021-12-14 DIAGNOSIS — K219 Gastro-esophageal reflux disease without esophagitis: Secondary | ICD-10-CM

## 2021-12-14 DIAGNOSIS — Z79899 Other long term (current) drug therapy: Secondary | ICD-10-CM

## 2021-12-14 DIAGNOSIS — E213 Hyperparathyroidism, unspecified: Secondary | ICD-10-CM | POA: Diagnosis not present

## 2021-12-14 DIAGNOSIS — R590 Localized enlarged lymph nodes: Secondary | ICD-10-CM

## 2021-12-14 DIAGNOSIS — E559 Vitamin D deficiency, unspecified: Secondary | ICD-10-CM

## 2021-12-14 NOTE — Progress Notes (Signed)
FOLLOW UP 6 MONTH ? ?Assessment and Plan: ? ?  ?Mixed hyperlipidemia ?Continue rosuvastatin '5mg'$  nightly ?decrease fatty foods ?increase activity.  ?-     Lipid panel ?- CBC ?-     CMP/GFR ?- TSH ?  ?Vitamin D deficiency ?Continue supplement for goal 60-100  ? ?Medication Management ?Continued ?  ?Gastroesophageal reflux disease with esophagitis without hemorrhage ?Well managed on current medications ?Discussed diet, avoiding triggers and other lifestyle changes ?  ?  ?Right anterior cervical adenopathy ?Waxing and waining; continue monthly self check ?Dr. Redmond Baseman has been following. On CT for parathyroid it showed increased size of lymph node- Dr. Redmond Baseman is planning possible removal vs biopsy only ? ?Hypercalcemia/Hyperparathyroidism ?Continue to monitor , 24 urine calcium was normal ?She does not take calcium supplements ?- Check PTH, CMP ? ?Discussed med's effects and SE's. Screening labs and tests as requested with regular follow-up as recommended. ?Over 30 minutes of face to face interview exam, counseling, chart review, and complex, high level critical decision making was performed this visit.  ? ?Future Appointments  ?Date Time Provider [place]  ?06/10/2022  9:00 AM Liane Comber, NP [facility] None  ? ? ?HPI  ?56 y.o. female  presents for 3 month follow up for  has Vitamin D deficiency; Medication management; Mixed hyperlipidemia; Elevated lipoprotein A level; Acid reflux; Anterior cervical adenopathy; B12 deficiency; Hypercalcemia; and Hyperparathyroidism ([place]) on their problem list.. ?  ?She does notice increased fatigue, does have elevated PTH and calcium. 24 hour urine Calcium was negative.  CT parathyroid done 11/26/21- showed increase in size of lymph node  No candidate parathyroid adenoma was identified.  ? ?She is following with Dr. Janace Hoard for right cervical mass/adenopathy.  She has had a CT soft tissue of neck 08/16/19. There is a 9x37m lobular heterogeneous right level lll lymph node. Did  follow up recently 11/06/2020, unchanged, plan to monitor.  Follow up imaging was recommended biopsy versus removal.  ?No night sweats, no weight loss. No other adenopathy. She has been having a globulus sensation in her throat but intermittent and she does have GERD. No ETOH or NSAIDS. No black stool, blood in stool.  ? ?Her blood pressure has been controlled at home, today their BP is BP: 100/60  ?BP Readings from Last 3 Encounters:  ?12/14/21 100/60  ?09/15/21 102/62  ?06/10/21 112/80  ? ? ?She does workout. She denies chest pain, shortness of breath, dizziness.  ? ?BMI is Body mass index is 25.92 kg/m?., she has been working on diet and exercise. Using weights.  ?Wt Readings from Last 3 Encounters:  ?12/14/21 151 lb (68.5 kg)  ?09/15/21 152 lb 12.8 oz (69.3 kg)  ?06/10/21 157 lb 12.8 oz (71.6 kg)  ? ? ? ?She is on cholesterol medication rosuvastatin '5mg'$ . Her cholesterol is not at goal. She is trying to move more. History of dad with MI in 671's PGM bypass in her 824's both smokers. The cholesterol last visit was:   ?Lab Results  ?Component Value Date  ? CHOL 144 06/10/2021  ? HDL 63 06/10/2021  ? [id number] 06/10/2021  ? TRIG 87 06/10/2021  ? CHOLHDL 2.3 06/10/2021  ? ? Last A1C in the office was:  ?Lab Results  ?Component Value Date  ? HGBA1C 4.9 06/10/2020  ? ?Patient is on Vitamin D supplement.   ?Lab Results  ?Component Value Date  ? VD25OH 91 06/10/2021  ?   ? ? ?  ? ?Current Medications:  ?Current Outpatient Medications on File Prior  to Visit  ?Medication Sig Dispense Refill  ? CHOLECALCIFEROL PO Take 5,000 Units by mouth daily. Taking every other day    ? famotidine (PEPCID) 40 MG tablet TAKE 1 TABLET DAILY FOR INDIGESTION, HEARTBURN, AND REFLUX 90 tablet 3  ? Multiple Vitamin (MULTIVITAMIN) tablet Take 1 tablet by mouth daily.    ? rosuvastatin (CRESTOR) 5 MG tablet TAKE 1 TABLET DAILY FOR CHOLESTEROL 90 tablet 3  ? Zinc 25 MG TABS Take by mouth.    ? phenazopyridine (PYRIDIUM) 200 MG tablet Take 1 tablet  (200 mg total) by mouth 3 (three) times daily as needed for pain. (Patient not taking: Reported on 12/14/2021) 20 tablet 0  ? ?No current facility-administered medications on file prior to visit.  ? ?Allergies:  ?No Known Allergies ? ?Medical History:  ?She has Vitamin D deficiency; Medication management; Mixed hyperlipidemia; Elevated lipoprotein A level; Acid reflux; Anterior cervical adenopathy; B12 deficiency; Hypercalcemia; and Hyperparathyroidism ([place]) on their problem list. ? ?Health Maintenance:   ?Immunization History  ?Administered Date(s) Administered  ? Influenza Inj Mdck Quad With Preservative 06/07/2019, 06/10/2020  ? Influenza Split 04/15/2011  ? Influenza,inj,Quad PF,6+ Mos 06/10/2021  ? PFIZER(Purple Top)SARS-COV-2 Vaccination 11/09/2019, 12/03/2019  ? Tdap 04/15/2011, 03/09/2016  ? ?Patient Care Team: ?Unk Pinto, MD as PCP - General (Internal Medicine) ?Pcp, No ? ?Surgical History:  ?She has a past surgical history that includes Cesarean section (08/09/2000) and Cataract extraction (Bilateral, 2019). ?Family History:  ?Herfamily history includes Breast cancer (age of onset: 66) in her cousin; Cancer in her maternal grandmother and mother; Colon polyps in her father; Diabetes in her father and paternal grandfather; Heart disease in her father and maternal grandfather; Hyperlipidemia in her mother; Hypertension in her father and maternal grandfather. ?Social History:  ?She reports that she quit smoking about 38 years ago. Her smoking use included cigarettes. She has a 0.75 pack-year smoking history. She has never used smokeless tobacco. She reports current alcohol use of about 1.0 standard drink per week. She reports that she does not use drugs. ? ?Review of Systems: ?Review of Systems  ?Constitutional:  Positive for malaise/fatigue. Negative for chills, fever and weight loss.  ?HENT: Negative.  Negative for congestion and hearing loss.   ?Eyes: Negative.  Negative for blurred vision and double  vision.  ?Respiratory: Negative.  Negative for cough and shortness of breath.   ?Cardiovascular: Negative.  Negative for chest pain, palpitations, orthopnea and leg swelling.  ?Gastrointestinal: Negative.  Negative for abdominal pain, constipation, diarrhea, heartburn, nausea and vomiting.  ?Genitourinary: Negative.   ?Musculoskeletal: Negative.  Negative for falls, joint pain and myalgias.  ?Skin: Negative.  Negative for rash.  ?Neurological:  Negative for dizziness, tingling, tremors, loss of consciousness and headaches.  ?Psychiatric/Behavioral:  Negative for depression, memory loss and suicidal ideas.   ? ?Physical Exam: ?Estimated body mass index is 25.92 kg/m? as calculated from the following: ?  Height as of 06/10/21: '5\' 4"'$  (1.626 m). ?  Weight as of this encounter: 151 lb (68.5 kg). ?BP 100/60   Pulse 63   Temp (!) 97.5 ?F (36.4 ?C)   Wt 151 lb (68.5 kg)   SpO2 97%   BMI 25.92 kg/m?  ? ?General Appearance: Well nourished, in no apparent distress.  ?Eyes: PERRLA, EOMs, conjunctiva no swelling or erythema, normal fundi and vessels.  ?Sinuses: No Frontal/maxillary tenderness  ?ENT/Mouth: Ext aud canals clear, normal light reflex with TMs without erythema, bulging. Good dentition. No erythema, swelling, or exudate on post pharynx. Tonsils not  swollen or erythematous. Hearing normal.  ?Neck: Supple, thyroid normal. No bruits  Right side marble size cervical lymphandepathy nodes, non-tender. ?Respiratory: Respiratory effort normal, BS equal bilaterally without rales, rhonchi, wheezing or stridor.  ?Cardio: RRR without murmurs, rubs or gallops. Brisk peripheral pulses without edema.  ?Chest: symmetric, with normal excursions and percussion.  ?Abdomen: Soft, nontender, no guarding, rebound, hernias, masses, or organomegaly.  ?Lymphatics: Non tender without lymphadenopathy. Left cervical lymph node, marble size. ?Musculoskeletal: Full ROM all peripheral extremities,5/5 strength, and normal gait.  ?Skin: lipoma  right anterior medial thigh. Warm, dry without rashes, lesions, ecchymosis. ?Neuro: Cranial nerves intact, reflexes equal bilaterally. Normal muscle tone, no cerebellar symptoms. Sensation intact.  ?Psych: Awake

## 2021-12-15 LAB — LIPID PANEL
Cholesterol: 170 mg/dL (ref <200)
HDL: 63 mg/dL (ref >=50)
LDL Cholesterol (Calc): 88 mg/dL (calc)
Non-HDL Cholesterol (Calc): 107 mg/dL (calc) (ref <130)
Total CHOL/HDL Ratio: 2.7 (calc) (ref <5.0)
Triglycerides: 96 mg/dL (ref <150)

## 2021-12-15 LAB — COMPLETE METABOLIC PANEL WITH GFR
AG Ratio: 1.7 (calc) (ref 1.0–2.5)
ALT: 17 U/L (ref 6–29)
AST: 16 U/L (ref 10–35)
Albumin: 4.4 g/dL (ref 3.6–5.1)
Alkaline phosphatase (APISO): 70 U/L (ref 37–153)
BUN: 11 mg/dL (ref 7–25)
CO2: 29 mmol/L (ref 20–32)
Calcium: 10.4 mg/dL (ref 8.6–10.4)
Chloride: 104 mmol/L (ref 98–110)
Creat: 0.78 mg/dL (ref 0.50–1.03)
Globulin: 2.6 g/dL (calc) (ref 1.9–3.7)
Glucose, Bld: 86 mg/dL (ref 65–99)
Potassium: 3.8 mmol/L (ref 3.5–5.3)
Sodium: 142 mmol/L (ref 135–146)
Total Bilirubin: 0.6 mg/dL (ref 0.2–1.2)
Total Protein: 7 g/dL (ref 6.1–8.1)
eGFR: 89 mL/min/{1.73_m2} (ref >=60)

## 2021-12-15 LAB — CBC WITH DIFFERENTIAL/PLATELET
Absolute Monocytes: 492 cells/uL (ref 200–950)
Basophils Absolute: 52 cells/uL (ref 0–200)
Basophils Relative: 1.3 %
Eosinophils Absolute: 40 cells/uL (ref 15–500)
Eosinophils Relative: 1 %
HCT: 40 % (ref 35.0–45.0)
Hemoglobin: 13.4 g/dL (ref 11.7–15.5)
Lymphs Abs: 1352 cells/uL (ref 850–3900)
MCH: 32 pg (ref 27.0–33.0)
MCHC: 33.5 g/dL (ref 32.0–36.0)
MCV: 95.5 fL (ref 80.0–100.0)
MPV: 10 fL (ref 7.5–12.5)
Monocytes Relative: 12.3 %
Neutro Abs: 2064 cells/uL (ref 1500–7800)
Neutrophils Relative %: 51.6 %
Platelets: 310 10*3/uL (ref 140–400)
RBC: 4.19 10*6/uL (ref 3.80–5.10)
RDW: 11.9 % (ref 11.0–15.0)
Total Lymphocyte: 33.8 %
WBC: 4 10*3/uL (ref 3.8–10.8)

## 2021-12-15 LAB — PTH, INTACT AND CALCIUM
Calcium: 10.2 mg/dL (ref 8.6–10.4)
PTH: 91 pg/mL — ABNORMAL HIGH (ref 16–77)

## 2021-12-15 LAB — TSH: TSH: 0.83 mIU/L (ref 0.40–4.50)

## 2021-12-16 ENCOUNTER — Encounter: Payer: Self-pay | Admitting: Nurse Practitioner

## 2021-12-22 ENCOUNTER — Ambulatory Visit (INDEPENDENT_AMBULATORY_CARE_PROVIDER_SITE_OTHER): Payer: BC Managed Care – PPO | Admitting: Nurse Practitioner

## 2021-12-22 VITALS — BP 122/78 | HR 63 | Temp 97.7°F | Wt 153.0 lb

## 2021-12-22 DIAGNOSIS — R3915 Urgency of urination: Secondary | ICD-10-CM | POA: Diagnosis not present

## 2021-12-22 DIAGNOSIS — R300 Dysuria: Secondary | ICD-10-CM

## 2021-12-22 DIAGNOSIS — N3000 Acute cystitis without hematuria: Secondary | ICD-10-CM | POA: Diagnosis not present

## 2021-12-22 MED ORDER — NITROFURANTOIN MONOHYD MACRO 100 MG PO CAPS: 100.0000 mg | ORAL_CAPSULE | Freq: Two times a day (BID) | ORAL | 0 refills | Status: AC

## 2021-12-22 NOTE — Progress Notes (Signed)
Assessment and Plan: ? ?Ashley Carpenter was seen today for a episodic visit. ? ?Diagnoses and all order for this visit: ? ?1. Acute cystitis without hematuria ?Consider OTC AZO. ?Consider cranberry supplement ?Stay well hydrated. ? ?- nitrofurantoin, macrocrystal-monohydrate, (MACROBID) 100 MG capsule; Take 1 capsule (100 mg total) by mouth 2 (two) times daily for 5 days.  Dispense: 10 capsule; Refill: 0 ?- Urinalysis w microscopic + reflex cultur ? ?2. Dysuria ?Consider OTC AZO. ?Consider cranberry supplement ?Stay well hydrated. ? ?3. Urinary urgency ? ?Continue to monitor for any increase in fever, chills, N/V, abdominal pain, blood in urine.  Notify office for further evaluation and treatment, questions or concerns if s/s fail to improve. The risks and benefits of my recommendations, as well as other treatment options were discussed with the patient today. Questions were answered. ? ?Further disposition pending results of labs. Discussed med's effects and SE's.   ? ?Over 15 minutes of exam, counseling, chart review, and critical decision making was performed.  ? ?Future Appointments  ?Date Time Provider [place]  ?06/10/2022  9:00 AM Liane Comber, NP [facility] None  ? ? ?------------------------------------------------------------------------------------------------------------------ ? ? ?HPI ?BP 122/78   Pulse 63   Temp 97.7 ?F (36.5 ?C)   Wt 153 lb (69.4 kg)   SpO2 98%   BMI 26.26 kg/m?  ?Subjective:  ? ? Ashley Carpenter is a 56 y.o. female who complains of burning with urination, dysuria, and urgency. She has had symptoms for 2 days. Patient also complains of getting worse over time. Patient denies back pain, fever, and headache. Patient does not have a history of recurrent UTI. Patient does not have a history of pyelonephritis.  ? ? ?Past Medical History:  ?Diagnosis Date  ? Allergy   ? Cervical radiculitis 07/09/2017  ? Occipital neuralgia of right side 07/09/2017  ?  ? ?No Known  Allergies ? ?Current Outpatient Medications on File Prior to Visit  ?Medication Sig  ? CHOLECALCIFEROL PO Take 5,000 Units by mouth daily. Taking every other day  ? famotidine (PEPCID) 40 MG tablet TAKE 1 TABLET DAILY FOR INDIGESTION, HEARTBURN, AND REFLUX  ? Multiple Vitamin (MULTIVITAMIN) tablet Take 1 tablet by mouth daily.  ? rosuvastatin (CRESTOR) 5 MG tablet TAKE 1 TABLET DAILY FOR CHOLESTEROL  ? Zinc 25 MG TABS Take by mouth.  ? phenazopyridine (PYRIDIUM) 200 MG tablet Take 1 tablet (200 mg total) by mouth 3 (three) times daily as needed for pain. (Patient not taking: Reported on 12/14/2021)  ? ?No current facility-administered medications on file prior to visit.  ? ? ?ROS: all negative except what is noted in the HPI.  ? ?ROS  ? ?Physical Exam: ? ?BP 122/78   Pulse 63   Temp 97.7 ?F (36.5 ?C)   Wt 153 lb (69.4 kg)   SpO2 98%   BMI 26.26 kg/m?  ? ?General Appearance: NAD.  Awake, conversant and cooperative. ?Eyes: PERRLA, EOMs intact.  Sclera white.  Conjunctiva without erythema. ?Sinuses: No frontal/maxillary tenderness.  No nasal discharge. Nares patent.  ?ENT/Mouth: Ext aud canals clear.  Bilateral TMs w/DOL and without erythema or bulging. Hearing intact.  Posterior pharynx without swelling or exudate.  Tonsils without swelling or erythema.  ?Neck: Supple.  No masses, nodules or thyromegaly. ?Respiratory: Effort is regular with non-labored breathing. Breath sounds are equal bilaterally without rales, rhonchi, wheezing or stridor.  ?Cardio: RRR with no MRGs. Brisk peripheral pulses without edema.  ?Abdomen: Active BS in all four quadrants.  Soft and non-tender  without guarding, rebound tenderness, hernias or masses. ?Lymphatics: Non tender without lymphadenopathy.  ?Musculoskeletal: Full ROM, 5/5 strength, normal ambulation.  No clubbing or cyanosis. ?Skin: Appropriate color for ethnicity. Warm without rashes, lesions, ecchymosis, ulcers.  ?Neuro: CN II-XII grossly normal. Normal muscle tone without  cerebellar symptoms and intact sensation.   ?Psych: AO X 3,  appropriate mood and affect, insight and judgment.  ?  ? ?Darrol Jump, NP ?4:12 PM ?[facility] ? ?

## 2021-12-24 LAB — URINALYSIS W MICROSCOPIC + REFLEX CULTURE
Bacteria, UA: NONE SEEN /HPF
Bilirubin Urine: NEGATIVE
Glucose, UA: NEGATIVE
Hyaline Cast: NONE SEEN /LPF
Ketones, ur: NEGATIVE
Nitrites, Initial: NEGATIVE
RBC / HPF: >=60 /HPF — AB (ref 0–2)
Specific Gravity, Urine: 1.012 (ref 1.001–1.035)
Squamous Epithelial / HPF: NONE SEEN /HPF (ref <=5)
pH: 7 (ref 5.0–8.0)

## 2021-12-24 LAB — URINE CULTURE
MICRO NUMBER:: 13405208
SPECIMEN QUALITY:: ADEQUATE

## 2021-12-24 LAB — CULTURE INDICATED

## 2022-01-15 ENCOUNTER — Other Ambulatory Visit: Payer: Self-pay | Admitting: Adult Health

## 2022-01-15 DIAGNOSIS — E782 Mixed hyperlipidemia: Secondary | ICD-10-CM

## 2022-01-15 DIAGNOSIS — K2100 Gastro-esophageal reflux disease with esophagitis, without bleeding: Secondary | ICD-10-CM

## 2022-01-28 ENCOUNTER — Other Ambulatory Visit: Payer: Self-pay | Admitting: Otolaryngology

## 2022-01-28 ENCOUNTER — Other Ambulatory Visit (HOSPITAL_COMMUNITY): Payer: Self-pay | Admitting: Otolaryngology

## 2022-01-28 DIAGNOSIS — E210 Primary hyperparathyroidism: Secondary | ICD-10-CM

## 2022-02-05 ENCOUNTER — Ambulatory Visit (HOSPITAL_COMMUNITY)
Admission: RE | Admit: 2022-02-05 | Discharge: 2022-02-05 | Disposition: A | Discharge status: Home / Self Care | Payer: BC Managed Care – PPO | Source: Ambulatory Visit | Attending: Otolaryngology | Admitting: Otolaryngology

## 2022-02-05 ENCOUNTER — Encounter (HOSPITAL_COMMUNITY)
Admission: RE | Admit: 2022-02-05 | Discharge: 2022-02-05 | Disposition: A | Discharge status: Home / Self Care | Payer: BC Managed Care – PPO | Source: Ambulatory Visit | Attending: Otolaryngology | Admitting: Otolaryngology

## 2022-02-05 DIAGNOSIS — E210 Primary hyperparathyroidism: Secondary | ICD-10-CM | POA: Diagnosis not present

## 2022-02-05 DIAGNOSIS — D351 Benign neoplasm of parathyroid gland: Secondary | ICD-10-CM | POA: Diagnosis not present

## 2022-02-05 DIAGNOSIS — E049 Nontoxic goiter, unspecified: Secondary | ICD-10-CM | POA: Diagnosis not present

## 2022-02-05 MED ORDER — TECHNETIUM TC 99M SESTAMIBI GENERIC - CARDIOLITE
25.2000 | Freq: Once | INTRAVENOUS | Status: DC | PRN
Start: 2022-02-05 — End: 2022-02-11

## 2022-04-15 DIAGNOSIS — E213 Hyperparathyroidism, unspecified: Secondary | ICD-10-CM | POA: Diagnosis not present

## 2022-04-15 DIAGNOSIS — R590 Localized enlarged lymph nodes: Secondary | ICD-10-CM | POA: Diagnosis not present

## 2022-05-28 NOTE — Pre-Procedure Instructions (Signed)
Surgical Instructions    Your procedure is scheduled on Wednesday, November 1st.  Report to [facility] Main Entrance "A" at 11:00 A.M., then check in with the Admitting office.  Call this number if you have problems the morning of surgery:  [phone]   If you have any questions prior to your surgery date call [phone]: Open Monday-Friday 8am-4pm    Remember:  Do not eat after midnight the night before your surgery  You may drink clear liquids until 10:00 AM the morning of your surgery.   Clear liquids allowed are: Water, Non-Citrus Juices (without pulp), Carbonated Beverages, Clear Tea, Black Coffee Only (NO MILK, CREAM OR POWDERED CREAMER of any kind), and Gatorade.    Take these medicines the morning of surgery with A SIP OF WATER  famotidine (PEPCID)  rosuvastatin (CRESTOR)  hydroxypropyl methylcellulose / hypromellose (ISOPTO TEARS / GONIOVISC)- if needed   Follow your surgeon's instructions on when to stop Aspirin.  If no instructions were given by your surgeon then you will need to call the office to get those instructions.     As of today, STOP taking any  Aleve, Naproxen, Ibuprofen, Motrin, Advil, Goody's, BC's, all herbal medications, fish oil, and all vitamins.                     Do NOT Smoke (Tobacco/Vaping) for 24 hours prior to your procedure.  If you use a CPAP at night, you may bring your mask/headgear for your overnight stay.   Contacts, glasses, piercing's, hearing aid's, dentures or partials may not be worn into surgery, please bring cases for these belongings.    For patients admitted to the hospital, discharge time will be determined by your treatment team.   Patients discharged the day of surgery will not be allowed to drive home, and someone needs to stay with them for 24 hours.  SURGICAL WAITING ROOM VISITATION Patients having surgery or a procedure may have no more than 2 support people in the waiting area - these visitors may rotate.   Children  under the age of 12 must have an adult with them who is not the patient. If the patient needs to stay at the hospital during part of their recovery, the visitor guidelines for inpatient rooms apply. Pre-op nurse will coordinate an appropriate time for 1 support person to accompany patient in pre-op.  This support person may not rotate.   Please refer to the [facility] website for the visitor guidelines for Inpatients (after your surgery is over and you are in a regular room).    Special instructions:   [place]- Preparing For Surgery  Before surgery, you can play an important role. Because skin is not sterile, your skin needs to be as free of germs as possible. You can reduce the number of germs on your skin by washing with CHG (chlorahexidine gluconate) Soap before surgery.  CHG is an antiseptic cleaner which kills germs and bonds with the skin to continue killing germs even after washing.    Oral Hygiene is also important to reduce your risk of infection.  Remember - BRUSH YOUR TEETH THE MORNING OF SURGERY WITH YOUR REGULAR TOOTHPASTE  Please do not use if you have an allergy to CHG or antibacterial soaps. If your skin becomes reddened/irritated stop using the CHG.  Do not shave (including legs and underarms) for at least 48 hours prior to first CHG shower. It is OK to shave your face.  Please follow these instructions  carefully.   Shower the NIGHT BEFORE SURGERY and the MORNING OF SURGERY  If you chose to wash your hair, wash your hair first as usual with your normal shampoo.  After you shampoo, rinse your hair and body thoroughly to remove the shampoo.  Use CHG Soap as you would any other liquid soap. You can apply CHG directly to the skin and wash gently with a scrungie or a clean washcloth.   Apply the CHG Soap to your body ONLY FROM THE NECK DOWN.  Do not use on open wounds or open sores. Avoid contact with your eyes, ears, mouth and genitals (private parts). Wash Face and  genitals (private parts)  with your normal soap.   Wash thoroughly, paying special attention to the area where your surgery will be performed.  Thoroughly rinse your body with warm water from the neck down.  DO NOT shower/wash with your normal soap after using and rinsing off the CHG Soap.  Pat yourself dry with a CLEAN TOWEL.  Wear CLEAN PAJAMAS to bed the night before surgery  Place CLEAN SHEETS on your bed the night before your surgery  DO NOT SLEEP WITH PETS.   Day of Surgery: Take a shower with CHG soap. Do not wear jewelry or makeup Do not wear lotions, powders, perfumes, or deodorant. Do not shave 48 hours prior to surgery.   Do not bring valuables to the hospital. [facility] is not responsible for any belongings or valuables. Do not wear nail polish, gel polish, artificial nails, or any other type of covering on natural nails (fingers and toes) If you have artificial nails or gel coating that need to be removed by a nail salon, please have this removed prior to surgery. Artificial nails or gel coating may interfere with anesthesia's ability to adequately monitor your vital signs. Wear Clean/Comfortable clothing the morning of surgery Remember to brush your teeth WITH YOUR REGULAR TOOTHPASTE.   Please read over the following fact sheets that you were given.    If you received a COVID test during your pre-op visit  it is requested that you wear a mask when out in public, stay away from anyone that may not be feeling well and notify your surgeon if you develop symptoms. If you have been in contact with anyone that has tested positive in the last 10 days please notify you surgeon.

## 2022-05-31 ENCOUNTER — Encounter (HOSPITAL_COMMUNITY): Payer: Self-pay

## 2022-05-31 ENCOUNTER — Encounter (HOSPITAL_COMMUNITY)
Admission: RE | Admit: 2022-05-31 | Discharge: 2022-05-31 | Disposition: A | Discharge status: Home / Self Care | Payer: BC Managed Care – PPO | Source: Ambulatory Visit | Attending: Otolaryngology | Admitting: Otolaryngology

## 2022-05-31 ENCOUNTER — Other Ambulatory Visit: Payer: Self-pay

## 2022-05-31 VITALS — BP 93/59 | HR 64 | Temp 98.2°F | Resp 17 | Ht 64.0 in | Wt 150.4 lb

## 2022-05-31 DIAGNOSIS — M5481 Occipital neuralgia: Secondary | ICD-10-CM

## 2022-05-31 DIAGNOSIS — Z01818 Encounter for other preprocedural examination: Secondary | ICD-10-CM | POA: Diagnosis not present

## 2022-05-31 HISTORY — DX: Localized enlarged lymph nodes: R59.0

## 2022-05-31 LAB — BASIC METABOLIC PANEL
Anion gap: 7 (ref 5–15)
BUN: 12 mg/dL (ref 6–20)
CO2: 28 mmol/L (ref 22–32)
Calcium: 10.3 mg/dL (ref 8.9–10.3)
Chloride: 106 mmol/L (ref 98–111)
Creatinine, Ser: 0.65 mg/dL (ref 0.44–1.00)
GFR, Estimated: >60 mL/min (ref >60)
Glucose, Bld: 95 mg/dL (ref 70–99)
Potassium: 4 mmol/L (ref 3.5–5.1)
Sodium: 141 mmol/L (ref 135–145)

## 2022-05-31 LAB — CBC
HCT: 39.6 % (ref 36.0–46.0)
Hemoglobin: 14 g/dL (ref 12.0–15.0)
MCH: 32.6 pg (ref 26.0–34.0)
MCHC: 35.4 g/dL (ref 30.0–36.0)
MCV: 92.1 fL (ref 80.0–100.0)
Platelets: 289 10*3/uL (ref 150–400)
RBC: 4.3 MIL/uL (ref 3.87–5.11)
RDW: 11.2 % — ABNORMAL LOW (ref 11.5–15.5)
WBC: 5.9 10*3/uL (ref 4.0–10.5)
nRBC: 0 % (ref 0.0–0.2)

## 2022-05-31 NOTE — Progress Notes (Signed)
PCP - Dr. Unk Pinto Cardiologist - denies  PPM/ICD - denies   Chest x-ray - 11/04/18 EKG - 06/10/20 Stress Test - denies ECHO - denies Cardiac Cath - denies  Sleep Study - denies   DM- denies  Last dose of GLP1 agonist-  n/a   Blood Thinner Instructions: n/a Aspirin Instructions: f/u with surgeon for instructions  ERAS Protcol - yes, no drink   COVID TEST- n/a   Anesthesia review: no  Patient denies shortness of breath, fever, cough and chest pain at PAT appointment   All instructions explained to the patient, with a verbal understanding of the material. Patient agrees to go over the instructions while at home for a better understanding. The opportunity to ask questions was provided.

## 2022-06-09 ENCOUNTER — Encounter (HOSPITAL_COMMUNITY)
Admission: RE | Disposition: A | Discharge status: Home / Self Care | Payer: Self-pay | Source: Home / Self Care | Attending: Otolaryngology

## 2022-06-09 ENCOUNTER — Ambulatory Visit (HOSPITAL_COMMUNITY): Payer: BC Managed Care – PPO

## 2022-06-09 ENCOUNTER — Encounter (HOSPITAL_COMMUNITY): Payer: Self-pay | Admitting: Otolaryngology

## 2022-06-09 ENCOUNTER — Other Ambulatory Visit: Payer: Self-pay

## 2022-06-09 ENCOUNTER — Ambulatory Visit (HOSPITAL_COMMUNITY)
Admission: RE | Admit: 2022-06-09 | Discharge: 2022-06-09 | Disposition: A | Discharge status: Home / Self Care | Payer: BC Managed Care – PPO | Attending: Otolaryngology | Admitting: Otolaryngology

## 2022-06-09 DIAGNOSIS — G709 Myoneural disorder, unspecified: Secondary | ICD-10-CM | POA: Diagnosis not present

## 2022-06-09 DIAGNOSIS — E213 Hyperparathyroidism, unspecified: Secondary | ICD-10-CM | POA: Insufficient documentation

## 2022-06-09 DIAGNOSIS — K219 Gastro-esophageal reflux disease without esophagitis: Secondary | ICD-10-CM | POA: Diagnosis not present

## 2022-06-09 DIAGNOSIS — D351 Benign neoplasm of parathyroid gland: Secondary | ICD-10-CM | POA: Insufficient documentation

## 2022-06-09 DIAGNOSIS — Z87891 Personal history of nicotine dependence: Secondary | ICD-10-CM | POA: Diagnosis not present

## 2022-06-09 DIAGNOSIS — R590 Localized enlarged lymph nodes: Secondary | ICD-10-CM | POA: Insufficient documentation

## 2022-06-09 HISTORY — PX: LYMPH NODE BIOPSY: SHX201

## 2022-06-09 HISTORY — PX: PARATHYROIDECTOMY: SHX19

## 2022-06-09 SURGERY — PARATHYROIDECTOMY
Anesthesia: General | Site: Neck | Laterality: Right

## 2022-06-09 MED ORDER — ONDANSETRON HCL 4 MG/2ML IJ SOLN
INTRAMUSCULAR | Status: AC
  Filled 2022-06-09: qty 6

## 2022-06-09 MED ORDER — PHENYLEPHRINE 80 MCG/ML (10ML) SYRINGE FOR IV PUSH (FOR BLOOD PRESSURE SUPPORT)
PREFILLED_SYRINGE | INTRAVENOUS | Status: AC
  Filled 2022-06-09: qty 10

## 2022-06-09 MED ORDER — LIDOCAINE-EPINEPHRINE 1 %-1:100000 IJ SOLN
INTRAMUSCULAR | Status: AC
  Filled 2022-06-09: qty 1

## 2022-06-09 MED ORDER — DEXAMETHASONE SODIUM PHOSPHATE 10 MG/ML IJ SOLN
INTRAMUSCULAR | Status: DC | PRN
  Administered 2022-06-09: 10 mg via INTRAVENOUS

## 2022-06-09 MED ORDER — MIDAZOLAM HCL 2 MG/2ML IJ SOLN
INTRAMUSCULAR | Status: AC
  Filled 2022-06-09: qty 2

## 2022-06-09 MED ORDER — MIDAZOLAM HCL 2 MG/2ML IJ SOLN
INTRAMUSCULAR | Status: DC | PRN
  Administered 2022-06-09: 2 mg via INTRAVENOUS

## 2022-06-09 MED ORDER — HYDROCODONE-ACETAMINOPHEN 5-325 MG PO TABS
1.0000 | ORAL_TABLET | Freq: Once | ORAL | Status: AC
  Administered 2022-06-09: 1 via ORAL

## 2022-06-09 MED ORDER — FENTANYL CITRATE (PF) 250 MCG/5ML IJ SOLN
INTRAMUSCULAR | Status: AC
  Filled 2022-06-09: qty 5

## 2022-06-09 MED ORDER — ACETAMINOPHEN 500 MG PO TABS: 1000.0000 mg | ORAL_TABLET | Freq: Once | ORAL | Status: AC

## 2022-06-09 MED ORDER — PHENYLEPHRINE HCL-NACL 20-0.9 MG/250ML-% IV SOLN: INTRAVENOUS | Status: DC | PRN

## 2022-06-09 MED ORDER — CHLORHEXIDINE GLUCONATE 0.12 % MT SOLN
OROMUCOSAL | Status: AC
  Administered 2022-06-09: 15 mL via OROMUCOSAL
  Filled 2022-06-09: qty 15

## 2022-06-09 MED ORDER — LIDOCAINE-EPINEPHRINE 1 %-1:100000 IJ SOLN
INTRAMUSCULAR | Status: DC | PRN
  Administered 2022-06-09: 1.5 mL

## 2022-06-09 MED ORDER — CEFAZOLIN SODIUM-DEXTROSE 2-3 GM-%(50ML) IV SOLR
INTRAVENOUS | Status: DC | PRN
  Administered 2022-06-09: 2 g via INTRAVENOUS

## 2022-06-09 MED ORDER — LACTATED RINGERS IV SOLN: INTRAVENOUS | Status: DC

## 2022-06-09 MED ORDER — PHENYLEPHRINE 80 MCG/ML (10ML) SYRINGE FOR IV PUSH (FOR BLOOD PRESSURE SUPPORT)
PREFILLED_SYRINGE | INTRAVENOUS | Status: DC | PRN
  Administered 2022-06-09 (×3): 80 ug via INTRAVENOUS

## 2022-06-09 MED ORDER — LIDOCAINE 2% (20 MG/ML) 5 ML SYRINGE
INTRAMUSCULAR | Status: AC
  Filled 2022-06-09: qty 5

## 2022-06-09 MED ORDER — HYDROCODONE-ACETAMINOPHEN 5-325 MG PO TABS: 1.0000 | ORAL_TABLET | Freq: Four times a day (QID) | ORAL | 0 refills | Status: DC | PRN

## 2022-06-09 MED ORDER — ACETAMINOPHEN 500 MG PO TABS
ORAL_TABLET | ORAL | Status: AC
  Administered 2022-06-09: 1000 mg via ORAL
  Filled 2022-06-09: qty 2

## 2022-06-09 MED ORDER — ROCURONIUM BROMIDE 10 MG/ML (PF) SYRINGE
PREFILLED_SYRINGE | INTRAVENOUS | Status: DC | PRN
  Administered 2022-06-09: 50 mg via INTRAVENOUS

## 2022-06-09 MED ORDER — LIDOCAINE 2% (20 MG/ML) 5 ML SYRINGE
INTRAMUSCULAR | Status: DC | PRN
  Administered 2022-06-09: 80 mg via INTRAVENOUS

## 2022-06-09 MED ORDER — PHENYLEPHRINE HCL-NACL 20-0.9 MG/250ML-% IV SOLN
INTRAVENOUS | Status: DC | PRN
  Administered 2022-06-09: 30 ug/min via INTRAVENOUS

## 2022-06-09 MED ORDER — ONDANSETRON HCL 4 MG/2ML IJ SOLN
INTRAMUSCULAR | Status: DC | PRN
  Administered 2022-06-09: 4 mg via INTRAVENOUS

## 2022-06-09 MED ORDER — 0.9 % SODIUM CHLORIDE (POUR BTL) OPTIME
TOPICAL | Status: DC | PRN
  Administered 2022-06-09: 1000 mL

## 2022-06-09 MED ORDER — SUGAMMADEX SODIUM 200 MG/2ML IV SOLN
INTRAVENOUS | Status: DC | PRN
  Administered 2022-06-09: 200 mg via INTRAVENOUS

## 2022-06-09 MED ORDER — PROPOFOL 10 MG/ML IV BOLUS
INTRAVENOUS | Status: AC
  Filled 2022-06-09: qty 20

## 2022-06-09 MED ORDER — FENTANYL CITRATE (PF) 100 MCG/2ML IJ SOLN: 25.0000 ug | INTRAMUSCULAR | Status: DC | PRN

## 2022-06-09 MED ORDER — HYDROCODONE-ACETAMINOPHEN 5-325 MG PO TABS
ORAL_TABLET | ORAL | Status: AC
  Filled 2022-06-09: qty 1

## 2022-06-09 MED ORDER — PROPOFOL 10 MG/ML IV BOLUS
INTRAVENOUS | Status: DC | PRN
  Administered 2022-06-09: 140 mg via INTRAVENOUS

## 2022-06-09 MED ORDER — CEFAZOLIN SODIUM-DEXTROSE 2-4 GM/100ML-% IV SOLN
INTRAVENOUS | Status: AC
  Filled 2022-06-09: qty 100

## 2022-06-09 MED ORDER — SCOPOLAMINE 1 MG/3DAYS TD PT72
MEDICATED_PATCH | TRANSDERMAL | Status: AC
  Administered 2022-06-09: 1.5 mg via TRANSDERMAL
  Filled 2022-06-09: qty 1

## 2022-06-09 MED ORDER — SUCCINYLCHOLINE CHLORIDE 200 MG/10ML IV SOSY
PREFILLED_SYRINGE | INTRAVENOUS | Status: AC
  Filled 2022-06-09: qty 10

## 2022-06-09 MED ORDER — KETOROLAC TROMETHAMINE 30 MG/ML IJ SOLN
INTRAMUSCULAR | Status: AC
  Filled 2022-06-09: qty 1

## 2022-06-09 MED ORDER — ORAL CARE MOUTH RINSE: 15.0000 mL | Freq: Once | OROMUCOSAL | Status: AC

## 2022-06-09 MED ORDER — AMISULPRIDE (ANTIEMETIC) 5 MG/2ML IV SOLN: 10.0000 mg | Freq: Once | INTRAVENOUS | Status: DC | PRN

## 2022-06-09 MED ORDER — FENTANYL CITRATE (PF) 250 MCG/5ML IJ SOLN
INTRAMUSCULAR | Status: DC | PRN
  Administered 2022-06-09: 100 ug via INTRAVENOUS
  Administered 2022-06-09: 50 ug via INTRAVENOUS

## 2022-06-09 MED ORDER — EPHEDRINE SULFATE-NACL 50-0.9 MG/10ML-% IV SOSY
PREFILLED_SYRINGE | INTRAVENOUS | Status: DC | PRN
  Administered 2022-06-09 (×2): 5 mg via INTRAVENOUS

## 2022-06-09 MED ORDER — SCOPOLAMINE 1 MG/3DAYS TD PT72: 1.0000 | MEDICATED_PATCH | Freq: Once | TRANSDERMAL | Status: DC

## 2022-06-09 MED ORDER — DEXAMETHASONE SODIUM PHOSPHATE 10 MG/ML IJ SOLN
INTRAMUSCULAR | Status: AC
  Filled 2022-06-09: qty 2

## 2022-06-09 MED ORDER — EPHEDRINE 5 MG/ML INJ
INTRAVENOUS | Status: AC
  Filled 2022-06-09: qty 5

## 2022-06-09 MED ORDER — CHLORHEXIDINE GLUCONATE 0.12 % MT SOLN: 15.0000 mL | Freq: Once | OROMUCOSAL | Status: AC

## 2022-06-09 SURGICAL SUPPLY — 42 items
BAG COUNTER SPONGE SURGICOUNT (BAG) ×2 IMPLANT
BLADE SURG 15 STRL LF DISP TIS (BLADE) IMPLANT
BLADE SURG 15 STRL SS (BLADE)
CANISTER SUCT 3000ML PPV (MISCELLANEOUS) ×2 IMPLANT
CLEANER TIP ELECTROSURG 2X2 (MISCELLANEOUS) ×2 IMPLANT
CNTNR URN SCR LID CUP LEK RST (MISCELLANEOUS) IMPLANT
CONT SPEC 4OZ STRL OR WHT (MISCELLANEOUS) ×4
CORD BIPOLAR FORCEPS 12FT (ELECTRODE) ×2 IMPLANT
COVER SURGICAL LIGHT HANDLE (MISCELLANEOUS) ×2 IMPLANT
DERMABOND ADVANCED .7 DNX12 (GAUZE/BANDAGES/DRESSINGS) ×2 IMPLANT
DRAIN JACKSON RD 7FR 3/32 (WOUND CARE) IMPLANT
DRAIN SNY 10 ROU (WOUND CARE) IMPLANT
DRAPE HALF SHEET 40X57 (DRAPES) IMPLANT
ELECT COATED BLADE 2.86 ST (ELECTRODE) ×2 IMPLANT
ELECT REM PT RETURN 9FT ADLT (ELECTROSURGICAL) ×2
ELECTRODE REM PT RTRN 9FT ADLT (ELECTROSURGICAL) ×2 IMPLANT
EVACUATOR SILICONE 100CC (DRAIN) ×2 IMPLANT
FORCEPS BIPOLAR SPETZLER 8 1.0 (NEUROSURGERY SUPPLIES) ×2 IMPLANT
GAUZE 4X4 16PLY ~~LOC~~+RFID DBL (SPONGE) ×2 IMPLANT
GLOVE BIO SURGEON STRL SZ7.5 (GLOVE) ×2 IMPLANT
GOWN STRL REUS W/ TWL LRG LVL3 (GOWN DISPOSABLE) ×4 IMPLANT
GOWN STRL REUS W/TWL LRG LVL3 (GOWN DISPOSABLE) ×4
KIT BASIN OR (CUSTOM PROCEDURE TRAY) ×2 IMPLANT
KIT TURNOVER KIT B (KITS) ×2 IMPLANT
LOCATOR NERVE 3 VOLT (DISPOSABLE) IMPLANT
NDL HYPO 25GX1X1/2 BEV (NEEDLE) ×2 IMPLANT
NEEDLE HYPO 25GX1X1/2 BEV (NEEDLE) ×2 IMPLANT
NS IRRIG 1000ML POUR BTL (IV SOLUTION) ×2 IMPLANT
PAD ARMBOARD 7.5X6 YLW CONV (MISCELLANEOUS) ×4 IMPLANT
PENCIL SMOKE EVACUATOR (MISCELLANEOUS) ×2 IMPLANT
POSITIONER HEAD DONUT 9IN (MISCELLANEOUS) IMPLANT
SHEARS HARMONIC 9CM CVD (BLADE) ×2 IMPLANT
SPONGE INTESTINAL PEANUT (DISPOSABLE) IMPLANT
STAPLER VISISTAT 35W (STAPLE) ×2 IMPLANT
SUT ETHILON 2 0 FS 18 (SUTURE) ×2 IMPLANT
SUT SILK 3 0 REEL (SUTURE) ×2 IMPLANT
SUT VIC AB 3-0 SH 27 (SUTURE) ×2
SUT VIC AB 3-0 SH 27X BRD (SUTURE) ×2 IMPLANT
SUT VICRYL 4-0 PS2 18IN ABS (SUTURE) ×2 IMPLANT
TOWEL GREEN STERILE FF (TOWEL DISPOSABLE) ×2 IMPLANT
TRAY ENT MC OR (CUSTOM PROCEDURE TRAY) ×2 IMPLANT
TUBE FEEDING 10FR FLEXIFLO (MISCELLANEOUS) IMPLANT

## 2022-06-09 NOTE — Brief Op Note (Signed)
06/09/2022  4:16 PM  PATIENT:  Ashley Carpenter  56 y.o. female  PRE-OPERATIVE DIAGNOSIS:  Hyperparathyroidism Enlarged lymph node in neck  POST-OPERATIVE DIAGNOSIS:  Hyperparathyroidism Enlarged lymph node in neck  PROCEDURE:  Procedure(s) with comments: PARATHYROIDECTOMY (Left) - RNFA RIGHT CERVICAL LYMPH NODE REMOVAL (Right)  SURGEON:  Surgeon(s) and Role:    Melida Quitter, MD - Primary  PHYSICIAN ASSISTANT:   ASSISTANTS: RNFA   ANESTHESIA:   general  EBL:  20 mL   BLOOD ADMINISTERED:none  DRAINS: none   LOCAL MEDICATIONS USED:  LIDOCAINE   SPECIMEN:  Source of Specimen:  Left parathyroid gland, left superior parathyroid gland, right neck nodes  DISPOSITION OF SPECIMEN:  PATHOLOGY  COUNTS:  YES  TOURNIQUET:  * No tourniquets in log *  DICTATION: .Note written in EPIC  PLAN OF CARE: Discharge to home after PACU  PATIENT DISPOSITION:  PACU - hemodynamically stable.   Delay start of Pharmacological VTE agent (>24hrs) due to surgical blood loss or risk of bleeding: no

## 2022-06-09 NOTE — H&P (Signed)
Ashley Carpenter is an 56 y.o. female.   Chief Complaint: Enlarged cervical lymph node, hyperparathyroidism HPI: 56 year old female with a persistent enlarged node in the right neck and hyperparathyroidism found to be isolated to the left superior gland.  She presents for surgical management.  Past Medical History:  Diagnosis Date   Allergy    Cervical radiculitis 07/09/2017   Enlarged lymph node in neck    Hyperparathyroidism ([place]) 2023   Occipital neuralgia of right side 07/09/2017    Past Surgical History:  Procedure Laterality Date   CATARACT EXTRACTION Bilateral 2019   CESAREAN SECTION  08/09/2000   x 1    Family History  Problem Relation Age of Onset   Hyperlipidemia Mother    Cancer Mother        small bowel CA- age 57 diagnosed   Diabetes Father    Heart disease Father    Hypertension Father    Colon polyps Father        non cancerous   Cancer Maternal Grandmother    Heart disease Maternal Grandfather    Hypertension Maternal Grandfather    Diabetes Paternal Grandfather    Breast cancer Cousin 76   Esophageal cancer Neg Hx    Rectal cancer Neg Hx    Stomach cancer Neg Hx    Social History:  reports that she quit smoking about 38 years ago. Her smoking use included cigarettes. She has a 0.75 pack-year smoking history. She has never used smokeless tobacco. She reports current alcohol use of about 1.0 standard drink of alcohol per week. She reports that she does not use drugs.  Allergies: No Known Allergies  Medications Prior to Admission  Medication Sig Dispense Refill   aspirin EC 81 MG tablet Take 81 mg by mouth daily. Swallow whole.     CHOLECALCIFEROL PO Take 1 tablet by mouth daily.     famotidine (PEPCID) 40 MG tablet TAKE 1 TABLET DAILY FOR INDIGESTION, HEARTBURN, AND REFLUX 90 tablet 3   hydroxypropyl methylcellulose / hypromellose (ISOPTO TEARS / GONIOVISC) 2.5 % ophthalmic solution Place 1 drop into both eyes as needed for dry eyes.     rosuvastatin  (CRESTOR) 5 MG tablet TAKE 1 TABLET DAILY FOR CHOLESTEROL 90 tablet 3   Zinc 25 MG TABS Take 12.5 mg by mouth daily.     phenazopyridine (PYRIDIUM) 200 MG tablet Take 1 tablet (200 mg total) by mouth 3 (three) times daily as needed for pain. (Patient not taking: Reported on 12/14/2021) 20 tablet 0    No results found for this or any previous visit (from the past 48 hour(s)). No results found.  Review of Systems  All other systems reviewed and are negative.   Blood pressure 136/68, pulse 64, temperature 98.2 F (36.8 C), temperature source Oral, resp. rate 18, height '5\' 4"'$  (1.626 m), weight 66.7 kg, SpO2 97 %. Physical Exam Constitutional:      Appearance: Normal appearance.  HENT:     Head: Normocephalic and atraumatic.     Right Ear: External ear normal.     Left Ear: External ear normal.     Nose: Nose normal.     Mouth/Throat:     Mouth: Mucous membranes are moist.     Pharynx: Oropharynx is clear.  Eyes:     Extraocular Movements: Extraocular movements intact.     Conjunctiva/sclera: Conjunctivae normal.     Pupils: Pupils are equal, round, and reactive to light.  Neck:     Comments:  Palpable enlarged right submandibular node. Cardiovascular:     Rate and Rhythm: Normal rate.  Pulmonary:     Effort: Pulmonary effort is normal.  Musculoskeletal:     Cervical back: Normal range of motion.  Skin:    General: Skin is warm and dry.  Neurological:     General: No focal deficit present.     Mental Status: She is alert and oriented to person, place, and time.  Psychiatric:        Mood and Affect: Mood normal.        Behavior: Behavior normal.        Thought Content: Thought content normal.        Judgment: Judgment normal.      Assessment/Plan Enlarged cervical lymph node, hyperparathyroidism  To OR for excision of right cervical lymph node and left superior parathyroidectomy.  Melida Quitter, MD 06/09/2022, 1:06 PM

## 2022-06-09 NOTE — Anesthesia Procedure Notes (Signed)
Procedure Name: Intubation Date/Time: 06/09/2022 2:13 PM  Performed by: Dorann Lodge, CRNAPre-anesthesia Checklist: Patient identified, Emergency Drugs available, Suction available and Patient being monitored Patient Re-evaluated:Patient Re-evaluated prior to induction Oxygen Delivery Method: Circle System Utilized Preoxygenation: Pre-oxygenation with 100% oxygen Induction Type: IV induction Ventilation: Mask ventilation without difficulty Laryngoscope Size: Mac and 3 Grade View: Grade I Tube type: Oral Tube size: 7.0 mm Number of attempts: 1 Airway Equipment and Method: Stylet Placement Confirmation: ETT inserted through vocal cords under direct vision, positive ETCO2 and breath sounds checked- equal and bilateral Secured at: 22 cm Tube secured with: Tape Dental Injury: Teeth and Oropharynx as per pre-operative assessment

## 2022-06-09 NOTE — Anesthesia Preprocedure Evaluation (Signed)
Anesthesia Evaluation  Patient identified by MRN, date of birth, ID band Patient awake    Reviewed: Allergy & Precautions, NPO status , Patient's Chart, lab work & pertinent test results  History of Anesthesia Complications Negative for: history of anesthetic complications  Airway Mallampati: I  TM Distance: >3 FB Neck ROM: Full    Dental  (+) Teeth Intact, Dental Advisory Given   Pulmonary former smoker   Pulmonary exam normal breath sounds clear to auscultation       Cardiovascular Exercise Tolerance: Good negative cardio ROS Normal cardiovascular exam Rhythm:Regular Rate:Normal     Neuro/Psych  Headaches  Neuromuscular disease    GI/Hepatic Neg liver ROS,GERD  Medicated and Controlled,,  Endo/Other  negative endocrine ROS  Hyperparathyroidism  Enlarged lymph node in neck    Renal/GU negative Renal ROS     Musculoskeletal negative musculoskeletal ROS (+)    Abdominal   Peds  Hematology negative hematology ROS (+)   Anesthesia Other Findings Day of surgery medications reviewed with the patient.  Reproductive/Obstetrics                             Anesthesia Physical Anesthesia Plan  ASA: 2  Anesthesia Plan: General   Post-op Pain Management: Tylenol PO (pre-op)*   Induction: Intravenous  PONV Risk Score and Plan: 4 or greater and Midazolam, Dexamethasone, Ondansetron and Scopolamine patch - Pre-op  Airway Management Planned: Oral ETT  Additional Equipment:   Intra-op Plan:   Post-operative Plan: Extubation in OR  Informed Consent: I have reviewed the patients History and Physical, chart, labs and discussed the procedure including the risks, benefits and alternatives for the proposed anesthesia with the patient or authorized representative who has indicated his/her understanding and acceptance.     Dental advisory given  Plan Discussed with: CRNA  Anesthesia Plan  Comments:        Anesthesia Quick Evaluation

## 2022-06-09 NOTE — Op Note (Signed)
Preop diagnosis: Hyperparathyroidism, enlarged right cervical lymph node Postop diagnosis: same Procedure: Left parathyroidectomy, excision of right cervical lymph node Surgeon: Redmond Baseman Assist: RNFA Anesth: General and local with 1% lidocaine with 1:100,000 epinephrine Compl: None Findings: Left parathyroid gland sent for frozen section and found to be normal.  Superior left parathyroid gland subsequently identified and removed, not sent for frozen section.  Dissection in the right neck did not demonstrate the clearly palpable nodule.  A small node and some surrounding tissue were removed and sent for pathology. Description:  After discussing risks, benefits, and alternatives, the patient was brought to the operative suite and placed on the operative table in the supine position.  Anesthesia was induced and the patient was intubated by the anesthesia team without difficulty.  The eyes were taped closed and a shoulder roll was placed.  The right neck and left parathyroid incisions were marked with a marking pen and injected with local anesthetic.  The neck was prepped and draped in sterile fashion.  The parathyroid incision was made with a 15 blade scalpel and extended through the subcutaneous and platysma layers with Bovie electrocautery.  The midline raphe of the strap muscles was divided and strap muscles retracted over the left thyroid lobe.  Dissection was then performed around the lobe identifying the superior pedicle and dissecting along the lateral border.  A parathyroid gland was encountered and was dissected free and sent for frozen section.  Later coming back as normal parathyroid gland, further dissection was performed around the gland toward the recurrent nerve which was identified.  Some fatty tissue was dissected from near the nerve but was not felt to represent parathyroid tissue.  The superior pedicle was then further dissected yielding the enlarged parathyroid gland that was dissected free and  removed.  This was passed for permanent pathology.  While waiting for the frozen section to return, the right neck incision was made with a 15 blade scalpel and extended through the subcutaneous and platysma layers with Bovie electrocautery.  Soft tissues were dissected to expose the sternocleidomastoid muscle, submandibular gland and the great vessels.  Palpation through this region did not yield the previously palpable node.  Dissection was performed fairly widely in search of the node.  A small node and some surrounding soft tissue was removed and passed to nursing for pathology.  Bleeding was controlled with cautery and ligature.  Each wound was copiously irrigated with saline.  The platysma was closed with 3-0 Vicryl in a simple, interrupted fashion.  The skin was closed in the subcutaneous layer with 4-0 Vicryl in a simple, interrupted fashion.  Skin glue was added.  The patient was cleaned off and drapes were removed.  She was then returned to anesthesia for wake-up and was extubated and moved to the recovery room in stable condition.

## 2022-06-09 NOTE — Transfer of Care (Signed)
Immediate Anesthesia Transfer of Care Note  Patient: Ashley Carpenter  Procedure(s) Performed: PARATHYROIDECTOMY (Left: Face) RIGHT CERVICAL LYMPH NODE REMOVAL (Right: Neck)  Patient Location: PACU  Anesthesia Type:General  Level of Consciousness: awake and drowsy  Airway & Oxygen Therapy: Patient Spontanous Breathing  Post-op Assessment: Report given to RN and Post -op Vital signs reviewed and stable  Post vital signs: Reviewed and stable  Last Vitals:  Vitals Value Taken Time  BP 129/60 06/09/22 1625  Temp    Pulse 100 06/09/22 1626  Resp    SpO2 97 % 06/09/22 1626  Vitals shown include unvalidated device data.  Last Pain:  Vitals:   06/09/22 1131  TempSrc:   PainSc: 0-No pain      Patients Stated Pain Goal: 0 (02/56/15 4884)  Complications: No notable events documented.

## 2022-06-10 ENCOUNTER — Encounter: Payer: BC Managed Care – PPO | Admitting: Nurse Practitioner

## 2022-06-10 ENCOUNTER — Encounter (HOSPITAL_COMMUNITY): Payer: Self-pay | Admitting: Otolaryngology

## 2022-06-10 NOTE — Addendum Note (Signed)
Addendum  created 06/10/22 1431 by Santa Lighter, MD   Attestation recorded in [place], [place] filed

## 2022-06-10 NOTE — Anesthesia Postprocedure Evaluation (Signed)
Anesthesia Post Note  Patient: Ashley Carpenter  Procedure(s) Performed: PARATHYROIDECTOMY (Left: Face) RIGHT CERVICAL LYMPH NODE REMOVAL (Right: Neck)     Patient location during evaluation: PACU Anesthesia Type: General Level of consciousness: awake and alert Pain management: pain level controlled Vital Signs Assessment: post-procedure vital signs reviewed and stable Respiratory status: spontaneous breathing, nonlabored ventilation, respiratory function stable and patient connected to nasal cannula oxygen Cardiovascular status: blood pressure returned to baseline and stable Postop Assessment: no apparent nausea or vomiting Anesthetic complications: no   No notable events documented.  Last Vitals:  Vitals:   06/09/22 1640 06/09/22 1655  BP: 133/74 117/69  Pulse: 98 100  Resp: 11 14  Temp:  36.7 C  SpO2: 95% 97%    Last Pain:  Vitals:   06/09/22 1655  TempSrc:   PainSc: [place] Delaynie Stetzer

## 2022-06-11 LAB — SURGICAL PATHOLOGY

## 2022-06-14 ENCOUNTER — Encounter: Payer: Self-pay | Admitting: Nurse Practitioner

## 2022-06-14 ENCOUNTER — Ambulatory Visit (INDEPENDENT_AMBULATORY_CARE_PROVIDER_SITE_OTHER): Payer: BC Managed Care – PPO | Admitting: Nurse Practitioner

## 2022-06-14 VITALS — BP 104/76 | HR 86 | Temp 97.5°F | Ht 64.5 in | Wt 148.0 lb

## 2022-06-14 DIAGNOSIS — Z136 Encounter for screening for cardiovascular disorders: Secondary | ICD-10-CM | POA: Diagnosis not present

## 2022-06-14 DIAGNOSIS — Z23 Encounter for immunization: Secondary | ICD-10-CM | POA: Diagnosis not present

## 2022-06-14 DIAGNOSIS — Z0000 Encounter for general adult medical examination without abnormal findings: Secondary | ICD-10-CM | POA: Diagnosis not present

## 2022-06-14 DIAGNOSIS — E213 Hyperparathyroidism, unspecified: Secondary | ICD-10-CM

## 2022-06-14 DIAGNOSIS — E559 Vitamin D deficiency, unspecified: Secondary | ICD-10-CM | POA: Diagnosis not present

## 2022-06-14 DIAGNOSIS — Z1389 Encounter for screening for other disorder: Secondary | ICD-10-CM | POA: Diagnosis not present

## 2022-06-14 DIAGNOSIS — N3000 Acute cystitis without hematuria: Secondary | ICD-10-CM

## 2022-06-14 DIAGNOSIS — Z13220 Encounter for screening for lipoid disorders: Secondary | ICD-10-CM | POA: Diagnosis not present

## 2022-06-14 DIAGNOSIS — Z79899 Other long term (current) drug therapy: Secondary | ICD-10-CM | POA: Diagnosis not present

## 2022-06-14 DIAGNOSIS — Z131 Encounter for screening for diabetes mellitus: Secondary | ICD-10-CM | POA: Diagnosis not present

## 2022-06-14 DIAGNOSIS — I10 Essential (primary) hypertension: Secondary | ICD-10-CM

## 2022-06-14 DIAGNOSIS — E892 Postprocedural hypoparathyroidism: Secondary | ICD-10-CM

## 2022-06-14 DIAGNOSIS — R590 Localized enlarged lymph nodes: Secondary | ICD-10-CM

## 2022-06-14 DIAGNOSIS — K2100 Gastro-esophageal reflux disease with esophagitis, without bleeding: Secondary | ICD-10-CM

## 2022-06-14 DIAGNOSIS — E782 Mixed hyperlipidemia: Secondary | ICD-10-CM

## 2022-06-14 NOTE — Progress Notes (Signed)
COMPLETE PHYSICAL  Assessment and Plan:  Encounter for Annual Physical Exam with abnormal findings Due annually  Health Maintenance reviewed Healthy lifestyle reviewed and goals set  Mixed hyperlipidemia Discussed lifestyle modifications. Recommended diet heavy in fruits and veggies, omega 3's. Decrease consumption of animal meats, cheeses, and dairy products. Remain active and exercise as tolerated. Continue to monitor. Check lipids/TSH   Vitamin D deficiency Continue supplement for goal 60-100  Continue to monitor  Gastroesophageal reflux disease with esophagitis without hemorrhage Colonoscopy UTD No suspected reflux complications ([place]/stricture). Lifestyle modification:  wt loss, avoid meals 2-3h before bedtime. Consider eliminating food triggers:  chocolate, caffeine, EtOH, acid/spicy food.   B12 def Continue supplement  Right anterior cervical adenopathy/Hx of parathyroidectomy Resolved cervical adenopathy S/p parathyroidectomy 06/09/2022 Follows with Dr. Redmond Baseman - F/U 11/9/203 Surgical site healing well.  Influenza vaccine needed Administered Patient tolerated well  Screening for cardiovascular disease Monitor EKG  Screening for hematuria or proteinuria Monitor UA  Medication management All medications discussed and reviewed in full. All questions and concerns regarding medications addressed.    Screening for diabetes mellitus Monitor A1c  Orders Placed This Encounter  Procedures   Flu Vaccine QUAD 36+ mos IM (Fluarix, Fluzone & Afluria Quad PF   Magnesium   Lipid panel   Hemoglobin A1c   VITAMIN D 25 Hydroxy (Vit-D Deficiency, Fractures)   Urinalysis, Routine w reflex microscopic   Microalbumin / creatinine urine ratio   Liver Profile   EKG 12-Lead    Notify office for further evaluation and treatment, questions or concerns if any reported s/s fail to improve.   The patient was advised to call back or seek an in-person evaluation if any  symptoms worsen or if the condition fails to improve as anticipated.   Further disposition pending results of labs. Discussed med's effects and SE's.    I discussed the assessment and treatment plan with the patient. The patient was provided an opportunity to ask questions and all were answered. The patient agreed with the plan and demonstrated an understanding of the instructions.  Discussed med's effects and SE's. Screening labs and tests as requested with regular follow-up as recommended.  I provided 35 minutes of face-to-face time during this encounter including counseling, chart review, and critical decision making was preformed.   Future Appointments  Date Time Provider [place]  12/15/2022  9:30 AM Darrol Jump, NP [facility] None  06/15/2023 10:00 AM Darrol Jump, NP [facility] None     HPI  56 y.o. female  presents for a complete physical and follow up for has Vitamin D deficiency; Medication management; Mixed hyperlipidemia; Elevated lipoprotein A level; Acid reflux; Anterior cervical adenopathy; B12 deficiency; Hypercalcemia; and Hyperparathyroidism ([place]) on their problem list.  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 She has long term partner she lives with. She has stress, teenage daughter, Jerene Pitch. Reports overall she is doing well.    She had a right cervical mass/adenopathy with a follow up CT soft tissue of neck 08/16/19 that revealed a 9x7 mm lobular heterogeneous right level lll lymph node. She had parathyroidectomy - nodule removed by Dr. Lennette Bihari Hollis/Dr. Melida Quitter 06/09/22 d/t a has a hx of hyperparathyroidism and cervical adenopathy.  She reports doing well.  Her incision sites (x2) are well approximated, healing well.   BMI is Body mass index is 25.01 kg/m., she has been working on diet and exercise, makes good choices, avoids sugar, lots of water, walks 30-60 min daily.  Wt Readings from Last 3 Encounters:  06/14/22 148 lb (67.1 kg)  06/09/22 147 lb  (66.7 kg)  05/31/22 150 lb 6.4 oz (68.2 kg)   Her blood pressure has been controlled at home, today their BP is BP: 104/76 She does workout. She denies chest pain, shortness of breath, dizziness.    She is on cholesterol medication rosuvastatin 5 mg after cardio IQ showing apoB 116 in 05/2018. Her cholesterol is at goal. History of dad with MI in 47's, PGM bypass in her 64's, both smokers. She has never smoked. The cholesterol last visit was:   Lab Results  Component Value Date   CHOL 170 12/14/2021   HDL 63 12/14/2021   LDLCALC 88 12/14/2021   TRIG 96 12/14/2021   CHOLHDL 2.7 12/14/2021    Last A1C in the office was:  Lab Results  Component Value Date   HGBA1C 4.9 06/10/2020   Patient is on Vitamin D supplement.   Lab Results  Component Value Date   VD25OH 103 06/10/2021     She is not on supplement - wililng to start  Lab Results  Component Value Date   IDPOEUMP53 614 06/10/2020      Current Medications:  Current Outpatient Medications on File Prior to Visit  Medication Sig Dispense Refill   aspirin EC 81 MG tablet Take 81 mg by mouth daily. Swallow whole.     CHOLECALCIFEROL PO Take 1 tablet by mouth daily.     famotidine (PEPCID) 40 MG tablet TAKE 1 TABLET DAILY FOR INDIGESTION, HEARTBURN, AND REFLUX 90 tablet 3   HYDROcodone-acetaminophen (NORCO) 5-325 MG tablet Take 1-2 tablets by mouth every 6 (six) hours as needed for moderate pain. 12 tablet 0   hydroxypropyl methylcellulose / hypromellose (ISOPTO TEARS / GONIOVISC) 2.5 % ophthalmic solution Place 1 drop into both eyes as needed for dry eyes.     rosuvastatin (CRESTOR) 5 MG tablet TAKE 1 TABLET DAILY FOR CHOLESTEROL 90 tablet 3   Zinc 25 MG TABS Take 12.5 mg by mouth daily.     phenazopyridine (PYRIDIUM) 200 MG tablet Take 1 tablet (200 mg total) by mouth 3 (three) times daily as needed for pain. (Patient not taking: Reported on 12/14/2021) 20 tablet 0   No current facility-administered medications on file prior to  visit.   Allergies:  No Known Allergies  Medical History:  She has Vitamin D deficiency; Medication management; Mixed hyperlipidemia; Elevated lipoprotein A level; Acid reflux; Anterior cervical adenopathy; B12 deficiency; Hypercalcemia; and Hyperparathyroidism ([place]) on their problem list.  Health Maintenance:   Immunization History  Administered Date(s) Administered   Influenza Inj Mdck Quad With Preservative 06/07/2019, 06/10/2020   Influenza Split 04/15/2011   Influenza,inj,Quad PF,6+ Mos 06/10/2021, 06/14/2022   PFIZER(Purple Top)SARS-COV-2 Vaccination 11/09/2019, 12/03/2019   Tdap 04/15/2011, 03/09/2016   Tetanus: 2017 Pneumovax:At age 63 Flu vaccine: 06/2022   Zostavax: Shingrix discussed with patient Covid 19: 2/2, pfizer  No LMP recorded. Patient is postmenopausal. Pap: 22022 Negative Due 3-5 years MGM: 10/2021 Negative  DEXA: due age 1  Colonoscopy: 09/2015 10 years  Last eye: 03/2022 - Dr. Nicki Reaper,  Last dental: 2023 q29m- Dr. KDeatra Ina Last derm: PRN   Patient Care Team: MUnk Pinto MD as PCP - General (Internal Medicine) Pcp, No  Surgical History:  She has a past surgical history that includes Cesarean section (08/09/2000); Cataract extraction (Bilateral, 2019); Parathyroidectomy (Left, 06/09/2022); and Lymph node biopsy (Right, 06/09/2022). Family History:  Herfamily history includes Breast cancer (age of onset: 650 in her cousin; Cancer in her  maternal grandmother and mother; Colon polyps in her father; Diabetes in her father and paternal grandfather; Heart disease in her father and maternal grandfather; Hyperlipidemia in her mother; Hypertension in her father and maternal grandfather. Social History:  She reports that she quit smoking about 38 years ago. Her smoking use included cigarettes. She has a 0.75 pack-year smoking history. She has never used smokeless tobacco. She reports current alcohol use of about 1.0 standard drink of alcohol per week. She reports  that she does not use drugs.  Review of Systems: Review of Systems  Constitutional: Negative.  Negative for malaise/fatigue and weight loss.  HENT: Negative.  Negative for hearing loss and tinnitus.   Eyes: Negative.  Negative for blurred vision and double vision.  Respiratory: Negative.  Negative for cough, sputum production, shortness of breath and wheezing.   Cardiovascular: Negative.  Negative for chest pain, palpitations, orthopnea, claudication, leg swelling and PND.  Gastrointestinal: Negative.  Negative for abdominal pain, blood in stool, constipation, diarrhea, heartburn, melena, nausea and vomiting.  Genitourinary: Negative.   Musculoskeletal: Negative.  Negative for falls, joint pain and myalgias.  Skin: Negative.  Negative for rash.  Neurological:  Negative for dizziness, tingling, sensory change, weakness and headaches.  Endo/Heme/Allergies:  Negative for polydipsia.  Psychiatric/Behavioral: Negative.  Negative for depression, memory loss, substance abuse and suicidal ideas. The patient is not nervous/anxious and does not have insomnia.   All other systems reviewed and are negative.   Physical Exam: Estimated body mass index is 25.01 kg/m as calculated from the following:   Height as of this encounter: 5' 4.5" (1.638 m).   Weight as of this encounter: 148 lb (67.1 kg). BP 104/76   Pulse 86   Temp (!) 97.5 F (36.4 C)   Ht 5' 4.5" (1.638 m)   Wt 148 lb (67.1 kg)   SpO2 99%   BMI 25.01 kg/m  General Appearance: Well nourished, in no apparent distress.  Eyes: PERRLA, EOMs, conjunctiva no swelling or erythema, normal fundi and vessels.  Sinuses: No Frontal/maxillary tenderness  ENT/Mouth: Ext aud canals clear, normal light reflex with TMs without erythema, bulging. Good dentition. No erythema, swelling, or exudate on post pharynx. Tonsils not swollen or erythematous. Hearing normal.  Neck: Supple, thyroid normal. No bruits  Respiratory: Respiratory effort normal, BS  equal bilaterally without rales, rhonchi, wheezing or stridor.  Cardio: RRR without murmurs, rubs or gallops. Brisk peripheral pulses without edema.  Chest: symmetric, with normal excursions and percussion.  Breasts: getting mammograms, defer today, no concerns Abdomen: Soft, nontender, no guarding, rebound, hernias, masses, or organomegaly.  Lymphatics: Non tender. Left cervical lymph node, marble size, similar previous documented. Musculoskeletal: Full ROM all peripheral extremities,5/5 strength, and normal gait.  Skin: Llipoma right anterior medial thigh. X2 linear surgical incisions left and right neck.  Well approximated.  OTA.  Surrounding skin WNL. Neuro: Cranial nerves intact, reflexes equal bilaterally. Normal muscle tone, no cerebellar symptoms. Sensation intact.  Psych: Awake and oriented X 3, normal affect, Insight and Judgment appropriate.    EKG: EKG - NSR  Darrol Jump, NP 11:17 AM [facility]

## 2022-06-14 NOTE — Patient Instructions (Signed)

## 2022-06-15 LAB — HEPATIC FUNCTION PANEL
AG Ratio: 1.4 (calc) (ref 1.0–2.5)
ALT: 15 U/L (ref 6–29)
AST: 18 U/L (ref 10–35)
Albumin: 4.4 g/dL (ref 3.6–5.1)
Alkaline phosphatase (APISO): 85 U/L (ref 37–153)
Bilirubin, Direct: 0.1 mg/dL (ref 0.0–0.2)
Globulin: 3.1 g/dL (calc) (ref 1.9–3.7)
Indirect Bilirubin: 0.6 mg/dL (calc) (ref 0.2–1.2)
Total Bilirubin: 0.7 mg/dL (ref 0.2–1.2)
Total Protein: 7.5 g/dL (ref 6.1–8.1)

## 2022-06-15 LAB — MICROALBUMIN / CREATININE URINE RATIO
Creatinine, Urine: 35 mg/dL (ref 20–275)
Microalb, Ur: <0.2 mg/dL

## 2022-06-15 LAB — LIPID PANEL
Cholesterol: 170 mg/dL (ref <200)
HDL: 64 mg/dL (ref >=50)
LDL Cholesterol (Calc): 87 mg/dL (calc)
Non-HDL Cholesterol (Calc): 106 mg/dL (calc) (ref <130)
Total CHOL/HDL Ratio: 2.7 (calc) (ref <5.0)
Triglycerides: 93 mg/dL (ref <150)

## 2022-06-15 LAB — URINALYSIS, ROUTINE W REFLEX MICROSCOPIC
Bacteria, UA: NONE SEEN /HPF
Bilirubin Urine: NEGATIVE
Glucose, UA: NEGATIVE
Hgb urine dipstick: NEGATIVE
Hyaline Cast: NONE SEEN /LPF
Ketones, ur: NEGATIVE
Nitrite: NEGATIVE
Protein, ur: NEGATIVE
RBC / HPF: NONE SEEN /HPF (ref 0–2)
Specific Gravity, Urine: 1.008 (ref 1.001–1.035)
Squamous Epithelial / HPF: NONE SEEN /HPF (ref <=5)
WBC, UA: NONE SEEN /HPF (ref 0–5)
pH: 7 (ref 5.0–8.0)

## 2022-06-15 LAB — HEMOGLOBIN A1C
Hgb A1c MFr Bld: 5.1 % of total Hgb (ref <5.7)
Mean Plasma Glucose: 100 mg/dL
eAG (mmol/L): 5.5 mmol/L

## 2022-06-15 LAB — VITAMIN D 25 HYDROXY (VIT D DEFICIENCY, FRACTURES): Vit D, 25-Hydroxy: 86 ng/mL (ref 30–100)

## 2022-06-15 LAB — MAGNESIUM: Magnesium: 1.9 mg/dL (ref 1.5–2.5)

## 2022-07-16 ENCOUNTER — Ambulatory Visit (INDEPENDENT_AMBULATORY_CARE_PROVIDER_SITE_OTHER): Payer: BC Managed Care – PPO | Admitting: Nurse Practitioner

## 2022-07-16 VITALS — BP 110/76 | HR 88 | Temp 97.3°F | Ht 64.5 in | Wt 149.2 lb

## 2022-07-16 DIAGNOSIS — E213 Hyperparathyroidism, unspecified: Secondary | ICD-10-CM

## 2022-07-16 DIAGNOSIS — R590 Localized enlarged lymph nodes: Secondary | ICD-10-CM | POA: Diagnosis not present

## 2022-07-16 NOTE — Progress Notes (Signed)
Assessment and Plan:  Ashley Carpenter was seen today for an episodic .  Diagnoses and all order for this visit:  1. Hyperparathyroidism ([place]) Continue to follow with ENT as directed.  - COMPLETE METABOLIC PANEL WITH GFR - Parathyroid Hormone, Intact w/Ca  2. Enlarged lymph node in neck  - COMPLETE METABOLIC PANEL WITH GFR - Parathyroid Hormone, Intact w/Ca   Continue to monitor for any increase in fever, chills, N/V, diarrhea, changes to bowel habits, blood in stool.  Notify office for further evaluation and treatment, questions or concerns if s/s fail to improve. The risks and benefits of my recommendations, as well as other treatment options were discussed with the patient today. Questions were answered.  Further disposition pending results of labs. Discussed med's effects and SE's.    Over 15 minutes of exam, counseling, chart review, and critical decision making was performed.   Future Appointments  Date Time Provider [place]  12/15/2022  9:30 AM Darrol Jump, NP [facility] None  06/15/2023 10:00 AM Cariah Salatino, Kenney Houseman, NP [facility] None    ------------------------------------------------------------------------------------------------------------------   HPI BP 110/76   Pulse 88   Temp (!) 97.3 F (36.3 C)   Ht 5' 4.5" (1.638 m)   Wt 149 lb 3.2 oz (67.7 kg)   SpO2 96%   BMI 25.21 kg/m    56 y.o.female presents for request to have updated blood work per Dr. Redmond Baseman, ENT after post-op procedure for hyperparathyroidism.  Overall she reports feeling well.  She has no additional concerns in clinic today.    Past Medical History:  Diagnosis Date   Allergy    Cervical radiculitis 07/09/2017   Enlarged lymph node in neck    Hyperparathyroidism ([place]) 2023   Occipital neuralgia of right side 07/09/2017     No Known Allergies  Current Outpatient Medications on File Prior to Visit  Medication Sig   aspirin EC 81 MG tablet Take 81 mg by mouth daily. Swallow  whole.   CHOLECALCIFEROL PO Take 1 tablet by mouth daily.   famotidine (PEPCID) 40 MG tablet TAKE 1 TABLET DAILY FOR INDIGESTION, HEARTBURN, AND REFLUX   HYDROcodone-acetaminophen (NORCO) 5-325 MG tablet Take 1-2 tablets by mouth every 6 (six) hours as needed for moderate pain.   hydroxypropyl methylcellulose / hypromellose (ISOPTO TEARS / GONIOVISC) 2.5 % ophthalmic solution Place 1 drop into both eyes as needed for dry eyes.   phenazopyridine (PYRIDIUM) 200 MG tablet Take 1 tablet (200 mg total) by mouth 3 (three) times daily as needed for pain.   rosuvastatin (CRESTOR) 5 MG tablet TAKE 1 TABLET DAILY FOR CHOLESTEROL   Zinc 25 MG TABS Take 12.5 mg by mouth daily.   No current facility-administered medications on file prior to visit.    ROS: all negative except what is noted in the HPI.   Physical Exam:  BP 110/76   Pulse 88   Temp (!) 97.3 F (36.3 C)   Ht 5' 4.5" (1.638 m)   Wt 149 lb 3.2 oz (67.7 kg)   SpO2 96%   BMI 25.21 kg/m   General Appearance: NAD.  Awake, conversant and cooperative. Eyes: PERRLA, EOMs intact.  Sclera white.  Conjunctiva without erythema. Sinuses: No frontal/maxillary tenderness.  No nasal discharge. Nares patent.  ENT/Mouth: Ext aud canals clear.  Bilateral TMs w/DOL and without erythema or bulging. Hearing intact.  Posterior pharynx without swelling or exudate.  Tonsils without swelling or erythema.  Neck: Supple.  No masses, nodules or thyromegaly. Respiratory: Effort is regular with  non-labored breathing. Breath sounds are equal bilaterally without rales, rhonchi, wheezing or stridor.  Cardio: RRR with no MRGs. Brisk peripheral pulses without edema.  Abdomen: Active BS in all four quadrants.  Soft and non-tender without guarding, rebound tenderness, hernias or masses. Lymphatics: Non tender without lymphadenopathy.  Musculoskeletal: Full ROM, 5/5 strength, normal ambulation.  No clubbing or cyanosis. Skin: Appropriate color for ethnicity. Warm  without rashes, lesions, ecchymosis, ulcers.  Neuro: CN II-XII grossly normal. Normal muscle tone without cerebellar symptoms and intact sensation.   Psych: AO X 3,  appropriate mood and affect, insight and judgment.     Darrol Jump, NP 11:50 AM [facility]

## 2022-07-17 LAB — COMPLETE METABOLIC PANEL WITH GFR
AG Ratio: 1.7 (calc) (ref 1.0–2.5)
ALT: 14 U/L (ref 6–29)
AST: 15 U/L (ref 10–35)
Albumin: 4.5 g/dL (ref 3.6–5.1)
Alkaline phosphatase (APISO): 82 U/L (ref 37–153)
BUN: 19 mg/dL (ref 7–25)
CO2: 27 mmol/L (ref 20–32)
Calcium: 9.6 mg/dL (ref 8.6–10.4)
Chloride: 104 mmol/L (ref 98–110)
Creat: 0.58 mg/dL (ref 0.50–1.03)
Globulin: 2.7 g/dL (calc) (ref 1.9–3.7)
Glucose, Bld: 96 mg/dL (ref 65–99)
Potassium: 4.3 mmol/L (ref 3.5–5.3)
Sodium: 141 mmol/L (ref 135–146)
Total Bilirubin: 0.5 mg/dL (ref 0.2–1.2)
Total Protein: 7.2 g/dL (ref 6.1–8.1)
eGFR: 105 mL/min/{1.73_m2} (ref >=60)

## 2022-07-17 LAB — PTH, INTACT AND CALCIUM
Calcium: 9.6 mg/dL (ref 8.6–10.4)
PTH: 51 pg/mL (ref 16–77)

## 2022-10-12 ENCOUNTER — Other Ambulatory Visit: Payer: Self-pay | Admitting: Internal Medicine

## 2022-10-12 DIAGNOSIS — Z1231 Encounter for screening mammogram for malignant neoplasm of breast: Secondary | ICD-10-CM

## 2022-11-24 ENCOUNTER — Ambulatory Visit
Admission: RE | Admit: 2022-11-24 | Discharge: 2022-11-24 | Disposition: A | Discharge status: Home / Self Care | Payer: BC Managed Care – PPO | Source: Ambulatory Visit | Attending: Internal Medicine | Admitting: Internal Medicine

## 2022-11-24 DIAGNOSIS — Z1231 Encounter for screening mammogram for malignant neoplasm of breast: Secondary | ICD-10-CM

## 2022-12-15 ENCOUNTER — Encounter: Payer: Self-pay | Admitting: Nurse Practitioner

## 2022-12-15 ENCOUNTER — Ambulatory Visit: Payer: BC Managed Care – PPO | Admitting: Nurse Practitioner

## 2022-12-15 VITALS — BP 122/78 | HR 74 | Temp 97.8°F | Resp 17 | Ht 64.5 in | Wt 151.6 lb

## 2022-12-15 DIAGNOSIS — R590 Localized enlarged lymph nodes: Secondary | ICD-10-CM | POA: Diagnosis not present

## 2022-12-15 DIAGNOSIS — E782 Mixed hyperlipidemia: Secondary | ICD-10-CM

## 2022-12-15 DIAGNOSIS — K2100 Gastro-esophageal reflux disease with esophagitis, without bleeding: Secondary | ICD-10-CM

## 2022-12-15 DIAGNOSIS — E559 Vitamin D deficiency, unspecified: Secondary | ICD-10-CM | POA: Diagnosis not present

## 2022-12-15 DIAGNOSIS — Z9089 Acquired absence of other organs: Secondary | ICD-10-CM

## 2022-12-15 DIAGNOSIS — Z9889 Other specified postprocedural states: Secondary | ICD-10-CM

## 2022-12-15 DIAGNOSIS — Z79899 Other long term (current) drug therapy: Secondary | ICD-10-CM

## 2022-12-15 DIAGNOSIS — E538 Deficiency of other specified B group vitamins: Secondary | ICD-10-CM | POA: Diagnosis not present

## 2022-12-15 NOTE — Patient Instructions (Signed)
Heartburn Heartburn is a type of pain or discomfort that can happen in your throat or chest. It is often described as a burning pain. It may also cause a bad, acid-like taste in your mouth. It may be caused by stomach contents that move back up (reflux) into the part of the body that moves food from your mouth to your stomach (esophagus). Heartburn may feel worse: When you lie down. When you bend over. At night. Follow these instructions at home: Eating and drinking  Avoid certain foods and drinks as told by your doctor. This may include: Coffee and tea, with or without caffeine. Drinks that have alcohol. Energy drinks and sports drinks. Carbonated drinks or sodas. Chocolate and cocoa. Peppermint and mint flavorings. Garlic and onions. Horseradish. Spicy and acidic foods, such as: Peppers. Chili powder and curry powder. Vinegar. Hot sauces and BBQ sauce. Citrus fruit juices and citrus fruits, such as: Oranges. Lemons. Limes. Tomato-based foods, such as: Red sauce and pizza with red sauce. Chili. Salsa. Fried and fatty foods, such as: Donuts. French fries and potato chips. High-fat dressings. High-fat meats, such as: Hot dogs and sausage. Rib eye steak. Ham and bacon. High-fat dairy items, such as: Whole milk. Butter. Cream cheese. Eat small meals often. Avoid eating large meals. Avoid drinking large amounts of liquid with your meals. Avoid eating meals during the 2-3 hours before bedtime. Avoid lying down right after you eat. Do not exercise right after you eat. Lifestyle     If you are overweight, lose an amount of weight that is healthy for you. Ask your doctor about a safe weight loss goal. Do not smoke or use any products that contain nicotine or tobacco. These can make your symptoms worse. If you need help quitting, ask your doctor. Wear loose clothes. Do not wear anything tight around your waist. Raise (elevate) the head of your bed about 6 inches (15 cm)  when you sleep. You can use a wedge to do this. Try to lower your stress. If you need help doing this, ask your doctor. Medicines Take over-the-counter and prescription medicines only as told by your doctor. Do not take aspirin or NSAIDs, such as ibuprofen, unless your doctor says it is okay. Stop medicines only as told by your doctor. If you stop taking some medicines too quickly, your symptoms may get worse. General instructions Watch for any changes in your symptoms. Keep all follow-up visits. Contact a doctor if: You have new symptoms. You lose weight and you do not know why. You have trouble swallowing, or it hurts to swallow. You have wheezing or a cough that keeps happening. Your symptoms do not get better with treatment. You have heartburn often for more than 2 weeks. Get help right away if: You have pain in your arms, neck, jaw, teeth, or back all of a sudden. You feel sweaty, dizzy, or light-headed all of a sudden. You have chest pain or shortness of breath. You vomit and your vomit looks like blood or coffee grounds. Your poop (stool) is bloody or black. These symptoms may be an emergency. Get help right away. Call your local emergency services (911 in the [place] Do not wait to see if the symptoms will go away. Do not drive yourself to the hospital. Summary Heartburn is a type of pain that can happen in your throat or chest. It can feel like a burning pain. It may also cause a bad, acid-like taste in your mouth. You may need to avoid certain   foods and drinks to help your symptoms. Ask your doctor what foods and drinks you should avoid. Take over-the-counter and prescription medicines only as told by your doctor. Do not take aspirin or NSAIDs, such as ibuprofen, unless your doctor told you to do so. Contact your doctor if your symptoms do not get better or they get worse. This information is not intended to replace advice given to you by your health care provider. Make sure  you discuss any questions you have with your health care provider. Document Revised: 01/30/2020 Document Reviewed: 01/30/2020 Elsevier Patient Education  2023 [employer]  

## 2022-12-15 NOTE — Progress Notes (Signed)
Follow Up  Assessment and Plan:  Mixed hyperlipidemia Discussed lifestyle modifications. Recommended diet heavy in fruits and veggies, omega 3's. Decrease consumption of animal meats, cheeses, and dairy products. Remain active and exercise as tolerated. Continue to monitor. Check lipids/TSH  Vitamin D deficiency Continue supplement for goal 60-100  Continue to monitor  Gastroesophageal reflux disease with esophagitis without hemorrhage Colonoscopy UTD No suspected reflux complications (Barret/stricture). Lifestyle modification:  wt loss, avoid meals 2-3h before bedtime. Consider eliminating food triggers:  chocolate, caffeine, EtOH, acid/spicy food.  B12 def Continue supplement  Right anterior cervical adenopathy/Hx of parathyroidectomy Resolved cervical adenopathy S/p parathyroidectomy 06/09/2022 Follows with Dr. Jenne Pane - F/U 11/9/203 Surgical site healing well.  Medication management All medications discussed and reviewed in full. All questions and concerns regarding medications addressed.    Orders Placed This Encounter  Procedures   CBC with Differential/Platelet   COMPLETE METABOLIC PANEL WITH GFR   Lipid panel   VITAMIN D 25 Hydroxy (Vit-D Deficiency, Fractures)   TSH   Parathyroid Hormone, Intact w/Ca    Notify office for further evaluation and treatment, questions or concerns if any reported s/s fail to improve.   The patient was advised to call back or seek an in-person evaluation if any symptoms worsen or if the condition fails to improve as anticipated.   Further disposition pending results of labs. Discussed med's effects and SE's.    I discussed the assessment and treatment plan with the patient. The patient was provided an opportunity to ask questions and all were answered. The patient agreed with the plan and demonstrated an understanding of the instructions.  Discussed med's effects and SE's. Screening labs and tests as requested with regular  follow-up as recommended.  I provided 20 minutes of face-to-face time during this encounter including counseling, chart review, and critical decision making was preformed.   Future Appointments  Date Time Provider Department Center  06/15/2023 10:00 AM Adela Glimpse, NP [facility] None     HPI  57 y.o. female  presents for a general follow up for has Vitamin D deficiency; Medication management; Mixed hyperlipidemia; Elevated lipoprotein A level; Acid reflux; Anterior cervical adenopathy; B12 deficiency; Hypercalcemia; and Hyperparathyroidism (HCC) on their problem list.  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 She has stress, teenage daughter, Nehemiah Settle. Reports overall she is doing well.  She has no new concerns in clinic today.  She had a right cervical mass/adenopathy with a follow up CT soft tissue of neck 08/16/19 that revealed a 9x7 mm lobular heterogeneous right level lll lymph node. She had parathyroidectomy - nodule removed by Dr. Caryn Bee Hollis/Dr. Christia Reading 06/09/22 d/t a has a hx of hyperparathyroidism and cervical adenopathy.  She reports doing well.  She plans to follow up next week.  She has a hx of acid reflux.  Has noticed more burning in the back of her throat.  Of note she has gained 2 lb over the last 6 months.  Trying to figure out triggers.  Takes Pepcid PRN with improvement.    BMI is Body mass index is 25.62 kg/m., she has been working on diet and exercise. Wt Readings from Last 3 Encounters:  12/15/22 151 lb 9.6 oz (68.8 kg)  07/16/22 149 lb 3.2 oz (67.7 kg)  06/14/22 148 lb (67.1 kg)   Her blood pressure has been controlled at home, today their BP is BP: 122/78 She does workout. She denies chest pain, shortness of breath, dizziness.  She is on cholesterol medication rosuvastatin 5 mg after cardio IQ showing apoB 116 in 05/2018. Her cholesterol is at goal. History of dad with MI in 58's, PGM bypass in her 48's, both  smokers. She has never smoked. The cholesterol last visit was:   Lab Results  Component Value Date   CHOL 170 06/14/2022   HDL 64 06/14/2022   LDLCALC 87 06/14/2022   TRIG 93 06/14/2022   CHOLHDL 2.7 06/14/2022    Last A1C in the office was:  Lab Results  Component Value Date   HGBA1C 5.1 06/14/2022   Patient is on Vitamin D supplement.   Lab Results  Component Value Date   VD25OH 86 06/14/2022     Last B12 Lab Results  Component Value Date   VITAMINB12 377 06/10/2020      Current Medications:  Current Outpatient Medications on File Prior to Visit  Medication Sig Dispense Refill   aspirin EC 81 MG tablet Take 81 mg by mouth daily. Swallow whole.     CHOLECALCIFEROL PO Take 1 tablet by mouth daily.     famotidine (PEPCID) 40 MG tablet TAKE 1 TABLET DAILY FOR INDIGESTION, HEARTBURN, AND REFLUX 90 tablet 3   hydroxypropyl methylcellulose / hypromellose (ISOPTO TEARS / GONIOVISC) 2.5 % ophthalmic solution Place 1 drop into both eyes as needed for dry eyes.     rosuvastatin (CRESTOR) 5 MG tablet TAKE 1 TABLET DAILY FOR CHOLESTEROL 90 tablet 3   Zinc 25 MG TABS Take 12.5 mg by mouth daily.     HYDROcodone-acetaminophen (NORCO) 5-325 MG tablet Take 1-2 tablets by mouth every 6 (six) hours as needed for moderate pain. (Patient not taking: Reported on 12/15/2022) 12 tablet 0   No current facility-administered medications on file prior to visit.   Allergies:  No Known Allergies  Medical History:  She has Vitamin D deficiency; Medication management; Mixed hyperlipidemia; Elevated lipoprotein A level; Acid reflux; Anterior cervical adenopathy; B12 deficiency; Hypercalcemia; and Hyperparathyroidism (HCC) on their problem list.  Health Maintenance:   Immunization History  Administered Date(s) Administered   Influenza Inj Mdck Quad With Preservative 06/07/2019, 06/10/2020   Influenza Split 04/15/2011   Influenza,inj,Quad PF,6+ Mos 06/10/2021, 06/14/2022   PFIZER(Purple  Top)SARS-COV-2 Vaccination 11/09/2019, 12/03/2019   Tdap 04/15/2011, 03/09/2016   Patient Care Team: Lucky Cowboy, MD as PCP - General (Internal Medicine) Pcp, No  Surgical History:  She has a past surgical history that includes Cesarean section (08/09/2000); Cataract extraction (Bilateral, 2019); Parathyroidectomy (Left, 06/09/2022); and Lymph node biopsy (Right, 06/09/2022). Family History:  Herfamily history includes Breast cancer (age of onset: 3) in her cousin; Cancer in her maternal grandmother and mother; Colon polyps in her father; Diabetes in her father and paternal grandfather; Heart disease in her father and maternal grandfather; Hyperlipidemia in her mother; Hypertension in her father and maternal grandfather. Social History:  She reports that she quit smoking about 39 years ago. Her smoking use included cigarettes. She has a 0.75 pack-year smoking history. She has never used smokeless tobacco. She reports current alcohol use of about 1.0 standard drink of alcohol per week. She reports that she does not use drugs.  Review of Systems: Review of Systems  Constitutional: Negative.  Negative for malaise/fatigue and weight loss.  HENT: Negative.  Negative for hearing loss and tinnitus.   Eyes: Negative.  Negative for blurred vision and double vision.  Respiratory: Negative.  Negative for cough, sputum production, shortness of breath and wheezing.   Cardiovascular: Negative.  Negative for chest  pain, palpitations, orthopnea, claudication, leg swelling and PND.  Gastrointestinal: Negative.  Negative for abdominal pain, blood in stool, constipation, diarrhea, heartburn, melena, nausea and vomiting.  Genitourinary: Negative.   Musculoskeletal: Negative.  Negative for falls, joint pain and myalgias.  Skin: Negative.  Negative for rash.  Neurological:  Negative for dizziness, tingling, sensory change, weakness and headaches.  Endo/Heme/Allergies:  Negative for polydipsia.   Psychiatric/Behavioral: Negative.  Negative for depression, memory loss, substance abuse and suicidal ideas. The patient is not nervous/anxious and does not have insomnia.   All other systems reviewed and are negative.   Physical Exam: Estimated body mass index is 25.62 kg/m as calculated from the following:   Height as of this encounter: 5' 4.5" (1.638 m).   Weight as of this encounter: 151 lb 9.6 oz (68.8 kg). BP 122/78   Pulse 74   Temp 97.8 F (36.6 C)   Resp 17   Ht 5' 4.5" (1.638 m)   Wt 151 lb 9.6 oz (68.8 kg)   SpO2 98%   BMI 25.62 kg/m  General Appearance: Well nourished, in no apparent distress.  Eyes: PERRLA, EOMs, conjunctiva no swelling or erythema, normal fundi and vessels.  Sinuses: No Frontal/maxillary tenderness  ENT/Mouth: Ext aud canals clear, normal light reflex with TMs without erythema, bulging. Good dentition. No erythema, swelling, or exudate on post pharynx. Tonsils not swollen or erythematous. Hearing normal.  Neck: Supple, thyroid normal. No bruits  Respiratory: Respiratory effort normal, BS equal bilaterally without rales, rhonchi, wheezing or stridor.  Cardio: RRR without murmurs, rubs or gallops. Brisk peripheral pulses without edema.  Chest: symmetric, with normal excursions and percussion.  Breasts: getting mammograms, defer today, no concerns Abdomen: Soft, nontender, no guarding, rebound, hernias, masses, or organomegaly.  Lymphatics: Non tender. Left cervical lymph node, marble size, similar previous documented. Musculoskeletal: Full ROM all peripheral extremities,5/5 strength, and normal gait.  Skin:  WNL.  Appropriate color for ethnicity.  No rashes, nodules, ecchymosis.   Neuro: Cranial nerves intact, reflexes equal bilaterally. Normal muscle tone, no cerebellar symptoms. Sensation intact.  Psych: Awake and oriented X 3, normal affect, Insight and Judgment appropriate.    Adela Glimpse, NP 9:47 AM [facility]

## 2022-12-16 LAB — CBC WITH DIFFERENTIAL/PLATELET
Absolute Monocytes: 273 cells/uL (ref 200–950)
Basophils Absolute: 50 cells/uL (ref 0–200)
Basophils Relative: 1.2 %
Eosinophils Absolute: 71 cells/uL (ref 15–500)
Eosinophils Relative: 1.7 %
HCT: 41.7 % (ref 35.0–45.0)
Hemoglobin: 13.8 g/dL (ref 11.7–15.5)
Lymphs Abs: 1441 cells/uL (ref 850–3900)
MCH: 31.1 pg (ref 27.0–33.0)
MCHC: 33.1 g/dL (ref 32.0–36.0)
MCV: 93.9 fL (ref 80.0–100.0)
MPV: 9.7 fL (ref 7.5–12.5)
Monocytes Relative: 6.5 %
Neutro Abs: 2365 cells/uL (ref 1500–7800)
Neutrophils Relative %: 56.3 %
Platelets: 365 10*3/uL (ref 140–400)
RBC: 4.44 10*6/uL (ref 3.80–5.10)
RDW: 11.8 % (ref 11.0–15.0)
Total Lymphocyte: 34.3 %
WBC: 4.2 10*3/uL (ref 3.8–10.8)

## 2022-12-16 LAB — LIPID PANEL
Cholesterol: 159 mg/dL (ref <200)
HDL: 59 mg/dL (ref >=50)
LDL Cholesterol (Calc): 74 mg/dL (calc)
Non-HDL Cholesterol (Calc): 100 mg/dL (calc) (ref <130)
Total CHOL/HDL Ratio: 2.7 (calc) (ref <5.0)
Triglycerides: 162 mg/dL — ABNORMAL HIGH (ref <150)

## 2022-12-16 LAB — COMPLETE METABOLIC PANEL WITH GFR
AG Ratio: 1.5 (calc) (ref 1.0–2.5)
ALT: 15 U/L (ref 6–29)
AST: 16 U/L (ref 10–35)
Albumin: 4.5 g/dL (ref 3.6–5.1)
Alkaline phosphatase (APISO): 71 U/L (ref 37–153)
BUN: 11 mg/dL (ref 7–25)
CO2: 28 mmol/L (ref 20–32)
Calcium: 9.7 mg/dL (ref 8.6–10.4)
Chloride: 105 mmol/L (ref 98–110)
Creat: 0.74 mg/dL (ref 0.50–1.03)
Globulin: 3 g/dL (calc) (ref 1.9–3.7)
Glucose, Bld: 68 mg/dL (ref 65–99)
Potassium: 4.1 mmol/L (ref 3.5–5.3)
Sodium: 143 mmol/L (ref 135–146)
Total Bilirubin: 0.5 mg/dL (ref 0.2–1.2)
Total Protein: 7.5 g/dL (ref 6.1–8.1)
eGFR: 94 mL/min/{1.73_m2} (ref >=60)

## 2022-12-16 LAB — PTH, INTACT AND CALCIUM
Calcium: 9.7 mg/dL (ref 8.6–10.4)
PTH: 34 pg/mL (ref 16–77)

## 2022-12-16 LAB — VITAMIN D 25 HYDROXY (VIT D DEFICIENCY, FRACTURES): Vit D, 25-Hydroxy: 91 ng/mL (ref 30–100)

## 2022-12-16 LAB — TSH: TSH: 1.58 mIU/L (ref 0.40–4.50)

## 2022-12-21 DIAGNOSIS — R590 Localized enlarged lymph nodes: Secondary | ICD-10-CM | POA: Diagnosis not present

## 2022-12-21 DIAGNOSIS — E213 Hyperparathyroidism, unspecified: Secondary | ICD-10-CM | POA: Diagnosis not present

## 2023-01-28 ENCOUNTER — Encounter: Payer: Self-pay | Admitting: Nurse Practitioner

## 2023-01-28 DIAGNOSIS — K2100 Gastro-esophageal reflux disease with esophagitis, without bleeding: Secondary | ICD-10-CM

## 2023-01-28 DIAGNOSIS — E782 Mixed hyperlipidemia: Secondary | ICD-10-CM

## 2023-01-31 MED ORDER — FAMOTIDINE 40 MG PO TABS: ORAL_TABLET | ORAL | 3 refills | Status: AC

## 2023-01-31 MED ORDER — FAMOTIDINE 40 MG PO TABS: ORAL_TABLET | ORAL | 3 refills | Status: DC

## 2023-01-31 MED ORDER — ROSUVASTATIN CALCIUM 5 MG PO TABS: 5.0000 mg | ORAL_TABLET | Freq: Every day | ORAL | 3 refills | Status: DC

## 2023-01-31 MED ORDER — ROSUVASTATIN CALCIUM 5 MG PO TABS: 5.0000 mg | ORAL_TABLET | Freq: Every day | ORAL | 3 refills | Status: AC

## 2023-06-15 ENCOUNTER — Ambulatory Visit (INDEPENDENT_AMBULATORY_CARE_PROVIDER_SITE_OTHER): Payer: BC Managed Care – PPO | Admitting: Nurse Practitioner

## 2023-06-15 ENCOUNTER — Encounter: Payer: Self-pay | Admitting: Nurse Practitioner

## 2023-06-15 VITALS — BP 120/80 | HR 75 | Temp 98.2°F | Ht 64.0 in | Wt 153.4 lb

## 2023-06-15 DIAGNOSIS — E782 Mixed hyperlipidemia: Secondary | ICD-10-CM

## 2023-06-15 DIAGNOSIS — Z131 Encounter for screening for diabetes mellitus: Secondary | ICD-10-CM | POA: Diagnosis not present

## 2023-06-15 DIAGNOSIS — Z13220 Encounter for screening for lipoid disorders: Secondary | ICD-10-CM | POA: Diagnosis not present

## 2023-06-15 DIAGNOSIS — E559 Vitamin D deficiency, unspecified: Secondary | ICD-10-CM

## 2023-06-15 DIAGNOSIS — R590 Localized enlarged lymph nodes: Secondary | ICD-10-CM

## 2023-06-15 DIAGNOSIS — Z136 Encounter for screening for cardiovascular disorders: Secondary | ICD-10-CM | POA: Diagnosis not present

## 2023-06-15 DIAGNOSIS — Z79899 Other long term (current) drug therapy: Secondary | ICD-10-CM | POA: Diagnosis not present

## 2023-06-15 DIAGNOSIS — Z1389 Encounter for screening for other disorder: Secondary | ICD-10-CM | POA: Diagnosis not present

## 2023-06-15 DIAGNOSIS — Z0000 Encounter for general adult medical examination without abnormal findings: Secondary | ICD-10-CM

## 2023-06-15 DIAGNOSIS — I10 Essential (primary) hypertension: Secondary | ICD-10-CM | POA: Diagnosis not present

## 2023-06-15 DIAGNOSIS — K219 Gastro-esophageal reflux disease without esophagitis: Secondary | ICD-10-CM

## 2023-06-15 DIAGNOSIS — Z9889 Other specified postprocedural states: Secondary | ICD-10-CM

## 2023-06-15 DIAGNOSIS — Z130 Encounter for screening for diseases of the blood and blood-forming organs and certain disorders involving the immune mechanism: Secondary | ICD-10-CM

## 2023-06-15 DIAGNOSIS — Z0001 Encounter for general adult medical examination with abnormal findings: Secondary | ICD-10-CM

## 2023-06-15 DIAGNOSIS — Z23 Encounter for immunization: Secondary | ICD-10-CM

## 2023-06-15 DIAGNOSIS — E538 Deficiency of other specified B group vitamins: Secondary | ICD-10-CM

## 2023-06-15 DIAGNOSIS — R5383 Other fatigue: Secondary | ICD-10-CM

## 2023-06-15 NOTE — Progress Notes (Signed)
COMPLETE PHYSICAL  Assessment and Plan:  Encounter for Annual Physical Exam with abnormal findings Due annually  Health Maintenance reviewed Healthy lifestyle reviewed and goals set  Mixed hyperlipidemia Discussed lifestyle modifications. Recommended diet heavy in fruits and veggies, omega 3's. Decrease consumption of animal meats, cheeses, and dairy products. Remain active and exercise as tolerated. Continue to monitor. Check lipids/TSH  Vitamin D deficiency Continue supplement for goal 60-100  Continue to monitor  Gastroesophageal reflux disease with esophagitis without hemorrhage Colonoscopy UTD No suspected reflux complications (Barret/stricture). Lifestyle modification:  wt loss, avoid meals 2-3h before bedtime. Consider eliminating food triggers:  chocolate, caffeine, EtOH, acid/spicy food.  B12 def Monitor levels  Right anterior cervical adenopathy/Hx of parathyroidectomy Resolved cervical adenopathy S/p parathyroidectomy 06/09/2022 Follows with Dr. Jenne Pane - F/U 11/9/203 Surgical site healing well.  Screening for cardiovascular disease Monitor EKG  Screening for hematuria or proteinuria Monitor UA  Medication management All medications discussed and reviewed in full. All questions and concerns regarding medications addressed.    Screening for diabetes mellitus Monitor A1c  Other fatigue Monitor iron, ferritin, TIBC, CBC  Need for flu vaccine Administered - patient tolerated well  Orders Placed This Encounter  Procedures   Fluzone Trivalent Flu Vaccine ([place] dose preparattion)   CBC with Differential/Platelet   COMPLETE METABOLIC PANEL WITH GFR   Lipid panel   TSH   Hemoglobin A1c   Insulin, random   VITAMIN D 25 Hydroxy (Vit-D Deficiency, Fractures)   Urinalysis, Routine w reflex microscopic   Microalbumin / creatinine urine ratio   Vitamin B12   Magnesium   Iron, TIBC and Ferritin Panel   EKG 12-Lead   Notify office for further evaluation  and treatment, questions or concerns if any reported s/s fail to improve.   The patient was advised to call back or seek an in-person evaluation if any symptoms worsen or if the condition fails to improve as anticipated.   Further disposition pending results of labs. Discussed med's effects and SE's.    I discussed the assessment and treatment plan with the patient. The patient was provided an opportunity to ask questions and all were answered. The patient agreed with the plan and demonstrated an understanding of the instructions.  Discussed med's effects and SE's. Screening labs and tests as requested with regular follow-up as recommended.  I provided 40 minutes of face-to-face time during this encounter including counseling, chart review, and critical decision making was preformed.   Future Appointments  Date Time Provider Department Center  06/14/2024 10:00 AM Adela Glimpse, NP [facility] None     HPI  57 y.o. female  presents for a complete physical and follow up for has Vitamin D deficiency; Medication management; Mixed hyperlipidemia; Elevated lipoprotein A level; Acid reflux; Anterior cervical adenopathy; B12 deficiency; Hypercalcemia; and Hyperparathyroidism (HCC) on their problem list.  Overall she reports feeling well today.  She does endorse increase in overall fatigue.  She reports sleeping well.  Denies snoring, apneic episodes.  Feels at though she follows a well balanced diet however does not eat a lot of red meat, chooses beans instead.    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 She has stress, teenage daughter, Nehemiah Settle. Reports overall she is doing well.    She had a right cervical mass/adenopathy with a follow up CT soft tissue of neck 08/16/19 that revealed a 9x7 mm lobular heterogeneous right level lll lymph node. She had parathyroidectomy - nodule removed by Dr.  Caryn Bee Hollis/Dr. Christia Reading 06/09/22 d/t a has a hx of  hyperparathyroidism and cervical adenopathy.  She reports doing well.  Her incision sites (x2) are well approximated, healing well.   BMI is Body mass index is 26.33 kg/m., she has been working on diet and exercise, makes good choices, avoids sugar, lots of water, walks 30-60 min daily.  Wt Readings from Last 3 Encounters:  06/15/23 153 lb 6.4 oz (69.6 kg)  12/15/22 151 lb 9.6 oz (68.8 kg)  07/16/22 149 lb 3.2 oz (67.7 kg)   Her blood pressure has been controlled at home, today their BP is BP: 120/80 She does workout. She denies chest pain, shortness of breath, dizziness.    She is on cholesterol medication rosuvastatin 5 mg after cardio IQ showing apoB 116 in 05/2018. Her cholesterol is at goal. History of dad with MI in 71's, PGM bypass in her 5's, both smokers. She has never smoked. The cholesterol last visit was:   Lab Results  Component Value Date   CHOL 159 12/15/2022   HDL 59 12/15/2022   LDLCALC 74 12/15/2022   TRIG 162 (H) 12/15/2022   CHOLHDL 2.7 12/15/2022    Last A1C in the office was:  Lab Results  Component Value Date   HGBA1C 5.1 06/14/2022   Patient is on Vitamin D supplement.   Lab Results  Component Value Date   VD25OH 57 12/15/2022     She is not on supplement - wililng to start  Lab Results  Component Value Date   VITAMINB12 377 06/10/2020      Current Medications:  Current Outpatient Medications on File Prior to Visit  Medication Sig Dispense Refill   aspirin EC 81 MG tablet Take 81 mg by mouth daily. Swallow whole.     CHOLECALCIFEROL PO Take 1 tablet by mouth daily.     famotidine (PEPCID) 40 MG tablet TAKE 1 TABLET DAILY FOR INDIGESTION, HEARTBURN, AND REFLUX 90 tablet 3   hydroxypropyl methylcellulose / hypromellose (ISOPTO TEARS / GONIOVISC) 2.5 % ophthalmic solution Place 1 drop into both eyes as needed for dry eyes.     rosuvastatin (CRESTOR) 5 MG tablet Take 1 tablet (5 mg total) by mouth daily. for cholesterol 90 tablet 3   Zinc 25 MG TABS  Take 12.5 mg by mouth daily.     HYDROcodone-acetaminophen (NORCO) 5-325 MG tablet Take 1-2 tablets by mouth every 6 (six) hours as needed for moderate pain. 12 tablet 0   No current facility-administered medications on file prior to visit.   Allergies:  No Known Allergies  Medical History:  She has Vitamin D deficiency; Medication management; Mixed hyperlipidemia; Elevated lipoprotein A level; Acid reflux; Anterior cervical adenopathy; B12 deficiency; Hypercalcemia; and Hyperparathyroidism (HCC) on their problem list.  Health Maintenance:   Immunization History  Administered Date(s) Administered   Fluzone Influenza virus vaccine,trivalent (IIV3), split virus 06/15/2023   Influenza Inj Mdck Quad With Preservative 06/07/2019, 06/10/2020   Influenza Split 04/15/2011   Influenza,inj,Quad PF,6+ Mos 06/10/2021, 06/14/2022   PFIZER(Purple Top)SARS-COV-2 Vaccination 11/09/2019, 12/03/2019   Tdap 04/15/2011, 03/09/2016   Tetanus: 2017 Pneumovax:At age 74 Flu vaccine: 06/2023 Zostavax: Shingrix discussed with patient Covid 19: 2/2, pfizer  No LMP recorded. Patient is postmenopausal. Pap: 16109 Negative Due 3-5 years MGM: 11/2022 Negative  DEXA: due age 40  Colonoscopy: 09/2015 10 years  Last eye: 03/2023 - Dr. Lorin Picket,  Last dental: 2024 q6m - Dr. Arlyce Dice  Last derm: PRN   Patient Care Team: Lucky Cowboy, MD  as PCP - General (Internal Medicine) Pcp, No  Surgical History:  She has a past surgical history that includes Cesarean section (08/09/2000); Cataract extraction (Bilateral, 2019); Parathyroidectomy (Left, 06/09/2022); and Lymph node biopsy (Right, 06/09/2022). Family History:  Herfamily history includes Breast cancer (age of onset: 32) in her cousin; Cancer in her maternal grandmother and mother; Colon polyps in her father; Diabetes in her father and paternal grandfather; Heart disease in her father and maternal grandfather; Hyperlipidemia in her mother; Hypertension in her  father and maternal grandfather. Social History:  She reports that she quit smoking about 39 years ago. Her smoking use included cigarettes. She started smoking about 42 years ago. She has a 0.8 pack-year smoking history. She has never used smokeless tobacco. She reports current alcohol use of about 1.0 standard drink of alcohol per week. She reports that she does not use drugs.  Review of Systems: Review of Systems  Constitutional: Negative.  Negative for malaise/fatigue and weight loss.  HENT: Negative.  Negative for hearing loss and tinnitus.   Eyes: Negative.  Negative for blurred vision and double vision.  Respiratory: Negative.  Negative for cough, sputum production, shortness of breath and wheezing.   Cardiovascular: Negative.  Negative for chest pain, palpitations, orthopnea, claudication, leg swelling and PND.  Gastrointestinal: Negative.  Negative for abdominal pain, blood in stool, constipation, diarrhea, heartburn, melena, nausea and vomiting.  Genitourinary: Negative.   Musculoskeletal: Negative.  Negative for falls, joint pain and myalgias.  Skin: Negative.  Negative for rash.  Neurological:  Negative for dizziness, tingling, sensory change, weakness and headaches.  Endo/Heme/Allergies:  Negative for polydipsia.  Psychiatric/Behavioral: Negative.  Negative for depression, memory loss, substance abuse and suicidal ideas. The patient is not nervous/anxious and does not have insomnia.   All other systems reviewed and are negative.   Physical Exam: Estimated body mass index is 26.33 kg/m as calculated from the following:   Height as of this encounter: 5\' 4"  (1.626 m).   Weight as of this encounter: 153 lb 6.4 oz (69.6 kg). BP 120/80   Pulse 75   Temp 98.2 F (36.8 C)   Ht 5\' 4"  (1.626 m)   Wt 153 lb 6.4 oz (69.6 kg)   SpO2 98%   BMI 26.33 kg/m  General Appearance: Well nourished, in no apparent distress.  Eyes: PERRLA, EOMs, conjunctiva no swelling or erythema, normal  fundi and vessels.  Sinuses: No Frontal/maxillary tenderness  ENT/Mouth: Ext aud canals clear, normal light reflex with TMs without erythema, bulging. Good dentition. No erythema, swelling, or exudate on post pharynx. Tonsils not swollen or erythematous. Hearing normal.  Neck: Supple, thyroid normal. No bruits  Respiratory: Respiratory effort normal, BS equal bilaterally without rales, rhonchi, wheezing or stridor.  Cardio: RRR without murmurs, rubs or gallops. Brisk peripheral pulses without edema.  Chest: symmetric, with normal excursions and percussion.  Breasts: getting mammograms, defer today, no concerns Abdomen: Soft, nontender, no guarding, rebound, hernias, masses, or organomegaly.  Lymphatics: Non tender. Left cervical lymph node, marble size, similar previous documented. Musculoskeletal: Full ROM all peripheral extremities,5/5 strength, and normal gait.  Skin: Llip[place] right anterior medial thigh. X2 linear surgical incisions left and right neck.  Well approximated.  OTA.  Surrounding skin WNL. Neuro: Cranial nerves intact, reflexes equal bilaterally. Normal muscle tone, no cerebellar symptoms. Sensation intact.  Psych: Awake and oriented X 3, normal affect, Insight and Judgment appropriate.    EKG: EKG - NSR  Adela Glimpse, NP 11:50 AM [facility]

## 2023-06-15 NOTE — Patient Instructions (Signed)
Influenza (Flu) Vaccine (Inactivated or Recombinant): What You Need to Know Many vaccine information statements are available in Spanish and other languages. See PromoAge.com.br. 1. Why get vaccinated? Influenza vaccine can prevent influenza (flu). Flu is a contagious disease that spreads around the [place] every year, usually between October and May. Anyone can get the flu, but it is more dangerous for some people. Infants and young children, people 58 years and older, pregnant people, and people with certain health conditions or a weakened immune system are at greatest risk of flu complications. Pneumonia, bronchitis, sinus infections, and ear infections are examples of flu-related complications. If you have a medical condition, such as heart disease, cancer, or diabetes, flu can make it worse. Flu can cause fever and chills, sore throat, muscle aches, fatigue, cough, headache, and runny or stuffy nose. Some people may have vomiting and diarrhea, though this is more common in children than adults. In an average year, thousands of people in the [place] die from flu, and many more are hospitalized. Flu vaccine prevents millions of illnesses and flu-related visits to the doctor each year. 2. Influenza vaccines CDC recommends everyone 6 months and older get vaccinated every flu season. Children 6 months through 51 years of age may need 2 doses during a single flu season. Everyone else needs only 1 dose each flu season. It takes about 2 weeks for protection to develop after vaccination. There are many flu viruses, and they are always changing. Each year a new flu vaccine is made to protect against the influenza viruses believed to be likely to cause disease in the upcoming flu season. Even when the vaccine doesn't exactly match these viruses, it may still provide some protection. Influenza vaccine does not cause flu. Influenza vaccine may be given at the same time as other vaccines. 3.  Talk with your health care provider Tell your vaccination provider if the person getting the vaccine: Has had an allergic reaction after a previous dose of influenza vaccine, or has any severe, life-threatening allergies Has ever had Guillain-Barr Syndrome (also called "GBS") In some cases, your health care provider may decide to postpone influenza vaccination until a future visit. Influenza vaccine can be administered at any time during pregnancy. People who are or will be pregnant during influenza season should receive inactivated influenza vaccine. People with minor illnesses, such as a cold, may be vaccinated. People who are moderately or severely ill should usually wait until they recover before getting influenza vaccine. Your health care provider can give you more information. 4. Risks of a vaccine reaction Soreness, redness, and swelling where the shot is given, fever, muscle aches, and headache can happen after influenza vaccination. There may be a very small increased risk of Guillain-Barr Syndrome (GBS) after inactivated influenza vaccine (the flu shot). Young children who get the flu shot along with pneumococcal vaccine (PCV13) and/or DTaP vaccine at the same time might be slightly more likely to have a seizure caused by fever. Tell your health care provider if a child who is getting flu vaccine has ever had a seizure. People sometimes faint after medical procedures, including vaccination. Tell your provider if you feel dizzy or have vision changes or ringing in the ears. As with any medicine, there is a very remote chance of a vaccine causing a severe allergic reaction, other serious injury, or death. 5. What if there is a serious problem? An allergic reaction could occur after the vaccinated person leaves the clinic. If you see signs of  a severe allergic reaction (hives, swelling of the face and throat, difficulty breathing, a fast heartbeat, dizziness, or weakness), call 9-1-1 and get  the person to the nearest hospital. For other signs that concern you, call your health care provider. Adverse reactions should be reported to the Vaccine Adverse Event Reporting System (VAERS). Your health care provider will usually file this report, or you can do it yourself. Visit the VAERS website at www.vaers.LAgents.no or call (774)365-3656. VAERS is only for reporting reactions, and VAERS staff members do not give medical advice. 6. The National Vaccine Injury Compensation Program The [employer] Vaccine Injury Compensation Program (VICP) is a federal program that was created to compensate people who may have been injured by certain vaccines. Claims regarding alleged injury or death due to vaccination have a time limit for filing, which may be as short as two years. Visit the VICP website at SpiritualWord.at or call 3187036210 to learn about the program and about filing a claim. 7. How can I learn more? Ask your health care provider. Call your local or state health department. Visit the website of the Food and Drug Administration (FDA) for vaccine package inserts and additional information at FinderList.no. Contact the Centers for Disease Control and Prevention (CDC): Call 787-694-5930 (1-800-CDC-INFO) or Visit CDC's website at BiotechRoom.com.cy. Source: CDC Vaccine Information Statement Inactivated Influenza Vaccine (03/14/2020) This same material is available at FootballExhibition.com.br for no charge. This information is not intended to replace advice given to you by your health care provider. Make sure you discuss any questions you have with your health care provider. Document Revised: 11/10/2022 Document Reviewed: 08/16/2022 Elsevier Patient Education  2024 [employer].

## 2023-06-16 LAB — URINALYSIS, ROUTINE W REFLEX MICROSCOPIC
Bacteria, UA: NONE SEEN /[HPF]
Bilirubin Urine: NEGATIVE
Glucose, UA: NEGATIVE
Hgb urine dipstick: NEGATIVE
Hyaline Cast: NONE SEEN /[LPF]
Ketones, ur: NEGATIVE
Nitrite: NEGATIVE
Protein, ur: NEGATIVE
RBC / HPF: NONE SEEN /[HPF] (ref 0–2)
Specific Gravity, Urine: 1.009 (ref 1.001–1.035)
Squamous Epithelial / HPF: NONE SEEN /[HPF] (ref <=5)
pH: 6 (ref 5.0–8.0)

## 2023-06-16 LAB — CBC WITH DIFFERENTIAL/PLATELET
Absolute Lymphocytes: 1236 {cells}/uL (ref 850–3900)
Absolute Monocytes: 343 {cells}/uL (ref 200–950)
Basophils Absolute: 52 {cells}/uL (ref 0–200)
Basophils Relative: 1.1 %
Eosinophils Absolute: 61 {cells}/uL (ref 15–500)
Eosinophils Relative: 1.3 %
HCT: 42 % (ref 35.0–45.0)
Hemoglobin: 13.8 g/dL (ref 11.7–15.5)
MCH: 31.3 pg (ref 27.0–33.0)
MCHC: 32.9 g/dL (ref 32.0–36.0)
MCV: 95.2 fL (ref 80.0–100.0)
MPV: 9.9 fL (ref 7.5–12.5)
Monocytes Relative: 7.3 %
Neutro Abs: 3008 {cells}/uL (ref 1500–7800)
Neutrophils Relative %: 64 %
Platelets: 318 10*3/uL (ref 140–400)
RBC: 4.41 10*6/uL (ref 3.80–5.10)
RDW: 11.8 % (ref 11.0–15.0)
Total Lymphocyte: 26.3 %
WBC: 4.7 10*3/uL (ref 3.8–10.8)

## 2023-06-16 LAB — COMPLETE METABOLIC PANEL WITH GFR
AG Ratio: 1.5 (calc) (ref 1.0–2.5)
ALT: 14 U/L (ref 6–29)
AST: 17 U/L (ref 10–35)
Albumin: 4.5 g/dL (ref 3.6–5.1)
Alkaline phosphatase (APISO): 76 U/L (ref 37–153)
BUN: 16 mg/dL (ref 7–25)
CO2: 29 mmol/L (ref 20–32)
Calcium: 9.8 mg/dL (ref 8.6–10.4)
Chloride: 103 mmol/L (ref 98–110)
Creat: 0.69 mg/dL (ref 0.50–1.03)
Globulin: 3 g/dL (ref 1.9–3.7)
Glucose, Bld: 90 mg/dL (ref 65–99)
Potassium: 4.1 mmol/L (ref 3.5–5.3)
Sodium: 140 mmol/L (ref 135–146)
Total Bilirubin: 0.7 mg/dL (ref 0.2–1.2)
Total Protein: 7.5 g/dL (ref 6.1–8.1)
eGFR: 101 mL/min/{1.73_m2} (ref >=60)

## 2023-06-16 LAB — HEMOGLOBIN A1C
Hgb A1c MFr Bld: 5.1 %{Hb} (ref <5.7)
Mean Plasma Glucose: 100 mg/dL
eAG (mmol/L): 5.5 mmol/L

## 2023-06-16 LAB — MICROSCOPIC MESSAGE

## 2023-06-16 LAB — LIPID PANEL
Cholesterol: 169 mg/dL (ref <200)
HDL: 65 mg/dL (ref >=50)
LDL Cholesterol (Calc): 80 mg/dL
Non-HDL Cholesterol (Calc): 104 mg/dL (ref <130)
Total CHOL/HDL Ratio: 2.6 (calc) (ref <5.0)
Triglycerides: 147 mg/dL (ref <150)

## 2023-06-16 LAB — VITAMIN D 25 HYDROXY (VIT D DEFICIENCY, FRACTURES): Vit D, 25-Hydroxy: 88 ng/mL (ref 30–100)

## 2023-06-16 LAB — MICROALBUMIN / CREATININE URINE RATIO
Creatinine, Urine: 29 mg/dL (ref 20–275)
Microalb, Ur: <0.2 mg/dL

## 2023-06-16 LAB — MAGNESIUM: Magnesium: 1.9 mg/dL (ref 1.5–2.5)

## 2023-06-16 LAB — IRON,TIBC AND FERRITIN PANEL
%SAT: 27 % (ref 16–45)
Ferritin: 53 ng/mL (ref 16–232)
Iron: 96 ug/dL (ref 45–160)
TIBC: 351 ug/dL (ref 250–450)

## 2023-06-16 LAB — VITAMIN B12: Vitamin B-12: 458 pg/mL (ref 200–1100)

## 2023-06-16 LAB — INSULIN, RANDOM: Insulin: 7.6 u[IU]/mL

## 2023-06-16 LAB — TSH: TSH: 1.77 m[IU]/L (ref 0.40–4.50)

## 2023-10-28 DIAGNOSIS — L089 Local infection of the skin and subcutaneous tissue, unspecified: Secondary | ICD-10-CM | POA: Diagnosis not present

## 2023-10-28 DIAGNOSIS — M722 Plantar fascial fibromatosis: Secondary | ICD-10-CM | POA: Diagnosis not present

## 2023-10-28 DIAGNOSIS — K219 Gastro-esophageal reflux disease without esophagitis: Secondary | ICD-10-CM | POA: Diagnosis not present

## 2023-10-28 DIAGNOSIS — Z7689 Persons encountering health services in other specified circumstances: Secondary | ICD-10-CM | POA: Diagnosis not present

## 2023-10-28 DIAGNOSIS — E782 Mixed hyperlipidemia: Secondary | ICD-10-CM | POA: Diagnosis not present

## 2023-10-28 DIAGNOSIS — Z1330 Encounter for screening examination for mental health and behavioral disorders, unspecified: Secondary | ICD-10-CM | POA: Diagnosis not present

## 2023-11-08 ENCOUNTER — Other Ambulatory Visit: Payer: Self-pay | Admitting: Adult Health Nurse Practitioner

## 2023-11-08 DIAGNOSIS — Z1231 Encounter for screening mammogram for malignant neoplasm of breast: Secondary | ICD-10-CM

## 2023-11-25 ENCOUNTER — Ambulatory Visit
Admission: RE | Admit: 2023-11-25 | Discharge: 2023-11-25 | Disposition: A | Discharge status: Home / Self Care | Source: Ambulatory Visit | Attending: Adult Health Nurse Practitioner | Admitting: Adult Health Nurse Practitioner

## 2023-11-25 DIAGNOSIS — Z1231 Encounter for screening mammogram for malignant neoplasm of breast: Secondary | ICD-10-CM | POA: Diagnosis not present

## 2024-05-29 DIAGNOSIS — Z01419 Encounter for gynecological examination (general) (routine) without abnormal findings: Secondary | ICD-10-CM | POA: Diagnosis not present

## 2024-05-29 DIAGNOSIS — Z1151 Encounter for screening for human papillomavirus (HPV): Secondary | ICD-10-CM | POA: Diagnosis not present

## 2024-05-29 DIAGNOSIS — Z130 Encounter for screening for diseases of the blood and blood-forming organs and certain disorders involving the immune mechanism: Secondary | ICD-10-CM | POA: Diagnosis not present

## 2024-05-29 DIAGNOSIS — Z124 Encounter for screening for malignant neoplasm of cervix: Secondary | ICD-10-CM | POA: Diagnosis not present

## 2024-06-14 ENCOUNTER — Encounter: Payer: BC Managed Care – PPO | Admitting: Nurse Practitioner
